# Patient Record
Sex: Male | Born: 1960 | Race: White | Hispanic: No | Marital: Married | Smoking: Never smoker
Health system: Southern US, Community
[De-identification: ages and names within clinical notes are randomized; demographics above are authoritative.]

## PROBLEM LIST (undated history)

## (undated) DIAGNOSIS — K55069 Acute infarction of intestine, part and extent unspecified: Secondary | ICD-10-CM

## (undated) DIAGNOSIS — I10 Essential (primary) hypertension: Secondary | ICD-10-CM

## (undated) DIAGNOSIS — K219 Gastro-esophageal reflux disease without esophagitis: Secondary | ICD-10-CM

## (undated) DIAGNOSIS — I81 Portal vein thrombosis: Secondary | ICD-10-CM

## (undated) DIAGNOSIS — I2510 Atherosclerotic heart disease of native coronary artery without angina pectoris: Secondary | ICD-10-CM

## (undated) HISTORY — PX: HERNIA REPAIR: SHX51

## (undated) HISTORY — PX: CORONARY ANGIOPLASTY WITH STENT PLACEMENT: SHX49

## (undated) HISTORY — PX: TONSILLECTOMY: SHX5217

---

## 2007-10-03 ENCOUNTER — Ambulatory Visit: Payer: Self-pay | Admitting: General Practice

## 2008-05-31 ENCOUNTER — Emergency Department: Payer: Self-pay | Admitting: Unknown Physician Specialty

## 2011-12-02 ENCOUNTER — Ambulatory Visit: Payer: Self-pay | Admitting: General Practice

## 2013-01-26 ENCOUNTER — Ambulatory Visit: Payer: Self-pay | Admitting: Family Medicine

## 2013-01-29 ENCOUNTER — Ambulatory Visit: Payer: Self-pay | Admitting: Family Medicine

## 2013-02-01 ENCOUNTER — Ambulatory Visit: Payer: Self-pay | Admitting: Family Medicine

## 2015-04-10 DIAGNOSIS — K55069 Acute infarction of intestine, part and extent unspecified: Secondary | ICD-10-CM | POA: Insufficient documentation

## 2015-04-10 DIAGNOSIS — Z8719 Personal history of other diseases of the digestive system: Secondary | ICD-10-CM | POA: Insufficient documentation

## 2015-04-16 DIAGNOSIS — R9389 Abnormal findings on diagnostic imaging of other specified body structures: Secondary | ICD-10-CM | POA: Insufficient documentation

## 2016-02-15 ENCOUNTER — Other Ambulatory Visit: Payer: Self-pay | Admitting: Family Medicine

## 2016-02-15 DIAGNOSIS — N500 Atrophy of testis: Secondary | ICD-10-CM

## 2016-02-16 ENCOUNTER — Ambulatory Visit
Admission: RE | Admit: 2016-02-16 | Discharge: 2016-02-16 | Disposition: A | Discharge status: Home / Self Care | Payer: BLUE CROSS/BLUE SHIELD | Source: Ambulatory Visit | Attending: Family Medicine | Admitting: Family Medicine

## 2016-02-16 DIAGNOSIS — N500 Atrophy of testis: Secondary | ICD-10-CM | POA: Insufficient documentation

## 2016-02-16 DIAGNOSIS — R938 Abnormal findings on diagnostic imaging of other specified body structures: Secondary | ICD-10-CM | POA: Insufficient documentation

## 2016-02-16 DIAGNOSIS — N503 Cyst of epididymis: Secondary | ICD-10-CM | POA: Diagnosis not present

## 2016-02-16 DIAGNOSIS — N433 Hydrocele, unspecified: Secondary | ICD-10-CM | POA: Diagnosis not present

## 2016-06-08 DIAGNOSIS — J339 Nasal polyp, unspecified: Secondary | ICD-10-CM | POA: Insufficient documentation

## 2016-08-15 DIAGNOSIS — E785 Hyperlipidemia, unspecified: Secondary | ICD-10-CM | POA: Insufficient documentation

## 2016-08-15 DIAGNOSIS — I2510 Atherosclerotic heart disease of native coronary artery without angina pectoris: Secondary | ICD-10-CM | POA: Insufficient documentation

## 2016-08-15 DIAGNOSIS — I2583 Coronary atherosclerosis due to lipid rich plaque: Secondary | ICD-10-CM | POA: Insufficient documentation

## 2016-08-15 DIAGNOSIS — R0789 Other chest pain: Secondary | ICD-10-CM | POA: Insufficient documentation

## 2016-09-21 DIAGNOSIS — Z86718 Personal history of other venous thrombosis and embolism: Secondary | ICD-10-CM | POA: Insufficient documentation

## 2016-10-27 DIAGNOSIS — M79604 Pain in right leg: Secondary | ICD-10-CM | POA: Insufficient documentation

## 2017-04-28 ENCOUNTER — Observation Stay
Admission: EM | Admit: 2017-04-28 | Discharge: 2017-04-29 | Disposition: A | Discharge status: Home / Self Care | Payer: Worker's Compensation | Attending: Surgery | Admitting: Surgery

## 2017-04-28 ENCOUNTER — Emergency Department: Payer: Worker's Compensation

## 2017-04-28 ENCOUNTER — Encounter: Payer: Self-pay | Admitting: Emergency Medicine

## 2017-04-28 DIAGNOSIS — R52 Pain, unspecified: Secondary | ICD-10-CM

## 2017-04-28 DIAGNOSIS — S2249XA Multiple fractures of ribs, unspecified side, initial encounter for closed fracture: Secondary | ICD-10-CM | POA: Diagnosis present

## 2017-04-28 DIAGNOSIS — Z955 Presence of coronary angioplasty implant and graft: Secondary | ICD-10-CM | POA: Insufficient documentation

## 2017-04-28 DIAGNOSIS — S27321A Contusion of lung, unilateral, initial encounter: Secondary | ICD-10-CM | POA: Diagnosis not present

## 2017-04-28 DIAGNOSIS — Z7982 Long term (current) use of aspirin: Secondary | ICD-10-CM | POA: Insufficient documentation

## 2017-04-28 DIAGNOSIS — Y93H3 Activity, building and construction: Secondary | ICD-10-CM | POA: Diagnosis not present

## 2017-04-28 DIAGNOSIS — S270XXA Traumatic pneumothorax, initial encounter: Secondary | ICD-10-CM | POA: Diagnosis not present

## 2017-04-28 DIAGNOSIS — W1789XA Other fall from one level to another, initial encounter: Secondary | ICD-10-CM | POA: Diagnosis not present

## 2017-04-28 DIAGNOSIS — I252 Old myocardial infarction: Secondary | ICD-10-CM | POA: Diagnosis not present

## 2017-04-28 DIAGNOSIS — Z7902 Long term (current) use of antithrombotics/antiplatelets: Secondary | ICD-10-CM | POA: Diagnosis not present

## 2017-04-28 DIAGNOSIS — I2510 Atherosclerotic heart disease of native coronary artery without angina pectoris: Secondary | ICD-10-CM | POA: Diagnosis not present

## 2017-04-28 DIAGNOSIS — K219 Gastro-esophageal reflux disease without esophagitis: Secondary | ICD-10-CM | POA: Diagnosis not present

## 2017-04-28 DIAGNOSIS — Y990 Civilian activity done for income or pay: Secondary | ICD-10-CM | POA: Diagnosis not present

## 2017-04-28 DIAGNOSIS — M25521 Pain in right elbow: Secondary | ICD-10-CM | POA: Insufficient documentation

## 2017-04-28 DIAGNOSIS — I10 Essential (primary) hypertension: Secondary | ICD-10-CM | POA: Insufficient documentation

## 2017-04-28 DIAGNOSIS — S2231XA Fracture of one rib, right side, initial encounter for closed fracture: Secondary | ICD-10-CM | POA: Insufficient documentation

## 2017-04-28 DIAGNOSIS — M25562 Pain in left knee: Secondary | ICD-10-CM | POA: Insufficient documentation

## 2017-04-28 DIAGNOSIS — Y9261 Building [any] under construction as the place of occurrence of the external cause: Secondary | ICD-10-CM | POA: Diagnosis not present

## 2017-04-28 DIAGNOSIS — Z86718 Personal history of other venous thrombosis and embolism: Secondary | ICD-10-CM | POA: Diagnosis not present

## 2017-04-28 DIAGNOSIS — S2241XA Multiple fractures of ribs, right side, initial encounter for closed fracture: Secondary | ICD-10-CM | POA: Diagnosis not present

## 2017-04-28 DIAGNOSIS — W19XXXA Unspecified fall, initial encounter: Secondary | ICD-10-CM | POA: Diagnosis not present

## 2017-04-28 DIAGNOSIS — Z79899 Other long term (current) drug therapy: Secondary | ICD-10-CM | POA: Diagnosis not present

## 2017-04-28 HISTORY — DX: Gastro-esophageal reflux disease without esophagitis: K21.9

## 2017-04-28 HISTORY — DX: Atherosclerotic heart disease of native coronary artery without angina pectoris: I25.10

## 2017-04-28 HISTORY — DX: Acute infarction of intestine, part and extent unspecified: K55.069

## 2017-04-28 HISTORY — DX: Portal vein thrombosis: I81

## 2017-04-28 HISTORY — DX: Essential (primary) hypertension: I10

## 2017-04-28 HISTORY — DX: Hemochromatosis, unspecified: E83.119

## 2017-04-28 LAB — CBC
HEMATOCRIT: 43.3 % (ref 40.0–52.0)
HEMOGLOBIN: 15.1 g/dL (ref 13.0–18.0)
MCH: 31.9 pg (ref 26.0–34.0)
MCHC: 34.9 g/dL (ref 32.0–36.0)
MCV: 91.4 fL (ref 80.0–100.0)
Platelets: 174 10*3/uL (ref 150–440)
RBC: 4.74 MIL/uL (ref 4.40–5.90)
RDW: 13.9 % (ref 11.5–14.5)
WBC: 9.7 10*3/uL (ref 3.8–10.6)

## 2017-04-28 LAB — BASIC METABOLIC PANEL
Anion gap: 8 (ref 5–15)
BUN: 13 mg/dL (ref 6–20)
CHLORIDE: 105 mmol/L (ref 101–111)
CO2: 25 mmol/L (ref 22–32)
CREATININE: 0.91 mg/dL (ref 0.61–1.24)
Calcium: 9.5 mg/dL (ref 8.9–10.3)
GFR calc Af Amer: >60 mL/min (ref >60)
GFR calc non Af Amer: >60 mL/min (ref >60)
Glucose, Bld: 120 mg/dL — ABNORMAL HIGH (ref 65–99)
POTASSIUM: 3.3 mmol/L — AB (ref 3.5–5.1)
SODIUM: 138 mmol/L (ref 135–145)

## 2017-04-28 LAB — PROTIME-INR
INR: 1.03
PROTHROMBIN TIME: 13.5 s (ref 11.4–15.2)

## 2017-04-28 MED ORDER — OXYCODONE-ACETAMINOPHEN 5-325 MG PO TABS
1.0000 | ORAL_TABLET | ORAL | Status: DC | PRN
  Administered 2017-04-28: 1 via ORAL
  Administered 2017-04-28 – 2017-04-29 (×2): 2 via ORAL
  Administered 2017-04-29: 1 via ORAL
  Filled 2017-04-28: qty 1
  Filled 2017-04-28 (×3): qty 2

## 2017-04-28 MED ORDER — ONDANSETRON 4 MG PO TBDP
4.0000 mg | ORAL_TABLET | Freq: Four times a day (QID) | ORAL | Status: DC | PRN
  Filled 2017-04-28: qty 1

## 2017-04-28 MED ORDER — MORPHINE SULFATE (PF) 4 MG/ML IV SOLN
4.0000 mg | Freq: Once | INTRAVENOUS | Status: AC
  Administered 2017-04-28: 4 mg via INTRAVENOUS
  Filled 2017-04-28: qty 1

## 2017-04-28 MED ORDER — ACETAMINOPHEN 650 MG RE SUPP: 650.0000 mg | Freq: Four times a day (QID) | RECTAL | Status: DC | PRN

## 2017-04-28 MED ORDER — IOPAMIDOL (ISOVUE-370) INJECTION 76%
100.0000 mL | Freq: Once | INTRAVENOUS | Status: AC | PRN
  Administered 2017-04-28: 100 mL via INTRAVENOUS

## 2017-04-28 MED ORDER — ONDANSETRON HCL 4 MG/2ML IJ SOLN
4.0000 mg | Freq: Once | INTRAMUSCULAR | Status: AC
  Administered 2017-04-28: 4 mg via INTRAVENOUS
  Filled 2017-04-28: qty 2

## 2017-04-28 MED ORDER — ONDANSETRON HCL 4 MG/2ML IJ SOLN: 4.0000 mg | Freq: Four times a day (QID) | INTRAMUSCULAR | Status: DC | PRN

## 2017-04-28 MED ORDER — ASPIRIN EC 81 MG PO TBEC
81.0000 mg | DELAYED_RELEASE_TABLET | Freq: Every day | ORAL | Status: DC
  Administered 2017-04-29: 81 mg via ORAL
  Filled 2017-04-28: qty 1

## 2017-04-28 MED ORDER — MORPHINE SULFATE (PF) 2 MG/ML IV SOLN
INTRAVENOUS | Status: AC
  Filled 2017-04-28: qty 1

## 2017-04-28 MED ORDER — TICAGRELOR 90 MG PO TABS
90.0000 mg | ORAL_TABLET | Freq: Two times a day (BID) | ORAL | Status: DC
  Administered 2017-04-28 – 2017-04-29 (×3): 90 mg via ORAL
  Filled 2017-04-28 (×4): qty 1

## 2017-04-28 MED ORDER — ROSUVASTATIN CALCIUM 10 MG PO TABS
10.0000 mg | ORAL_TABLET | Freq: Every day | ORAL | Status: DC
  Administered 2017-04-28 – 2017-04-29 (×2): 10 mg via ORAL
  Filled 2017-04-28 (×3): qty 1

## 2017-04-28 MED ORDER — HYDROMORPHONE HCL 1 MG/ML IJ SOLN
1.0000 mg | Freq: Once | INTRAMUSCULAR | Status: AC
  Administered 2017-04-28: 1 mg via INTRAVENOUS
  Filled 2017-04-28: qty 1

## 2017-04-28 MED ORDER — ENOXAPARIN SODIUM 40 MG/0.4ML ~~LOC~~ SOLN
40.0000 mg | SUBCUTANEOUS | Status: DC
  Filled 2017-04-28: qty 0.4

## 2017-04-28 MED ORDER — MORPHINE SULFATE (PF) 2 MG/ML IV SOLN
2.0000 mg | INTRAVENOUS | Status: DC | PRN
  Administered 2017-04-28: 2 mg via INTRAVENOUS

## 2017-04-28 MED ORDER — METOPROLOL SUCCINATE 12.5 MG HALF TABLET
12.5000 mg | ORAL_TABLET | Freq: Every day | ORAL | Status: DC
  Administered 2017-04-28 – 2017-04-29 (×2): 12.5 mg via ORAL
  Filled 2017-04-28 (×2): qty 1

## 2017-04-28 MED ORDER — ACETAMINOPHEN 325 MG PO TABS: 650.0000 mg | ORAL_TABLET | Freq: Four times a day (QID) | ORAL | Status: DC | PRN

## 2017-04-28 MED ORDER — PANTOPRAZOLE SODIUM 40 MG PO TBEC
40.0000 mg | DELAYED_RELEASE_TABLET | Freq: Every day | ORAL | Status: DC
  Administered 2017-04-29: 40 mg via ORAL
  Filled 2017-04-28: qty 1

## 2017-04-28 MED ORDER — SODIUM CHLORIDE 0.9 % IV BOLUS (SEPSIS)
1000.0000 mL | Freq: Once | INTRAVENOUS | Status: AC
  Administered 2017-04-28: 1000 mL via INTRAVENOUS

## 2017-04-28 NOTE — Progress Notes (Signed)
Dr. Excell Seltzer ordered telemetry monitoring for pt.

## 2017-04-28 NOTE — ED Notes (Signed)
c collar removed by dr Lenard Lance

## 2017-04-28 NOTE — ED Notes (Addendum)
Bruising/abrasion like area to lower back. Swelling to right neck and behind right ear; bruising also present here. Bruising to trunk at left mid axillary. Pt fell from second story of house.  No LOC per pt.  Bruising to both knees.  Knees are old bruises from blood thinners per pt. Pt reports has bruises all the time r/t his blood thinner

## 2017-04-28 NOTE — Progress Notes (Signed)
Pts. Wife requesting pt. Be on telemetry box.Dr. Excell Seltzer paged 3 times with no return call.

## 2017-04-28 NOTE — ED Notes (Signed)
Given water, ok per dr Lenard Lance

## 2017-04-28 NOTE — ED Notes (Signed)
Pt completed COC and w/c urine per profile; pt given given his copy of COC and employer present in pt room got his copy; hand delivered specimen and COC to lab for carrier pickup

## 2017-04-28 NOTE — Progress Notes (Signed)
Patient is admitted to room 138 from ED. A&O x4. Ambulated with SBA. Wife at bedside and asked many questions. Is very attentive. Bruising on chest, back, and neck area. Bed alarm on for safety. Oriented to room, call light, TV, and bed controls.

## 2017-04-28 NOTE — ED Notes (Signed)
Patient transported to CT 

## 2017-04-28 NOTE — ED Notes (Signed)
philly collar applied 

## 2017-04-28 NOTE — ED Notes (Signed)
Pt presents post fall from 6-7 feet. Denies LOC but states that he was SOB at the time of the injury. States that he hit his head on something, possibly a step and that he hit his right shoulder. He c/o right shoulder pain, mid back pain, and still some SOB. He is on Brilinta; has hemachromatosis hx. Pt in obvious pain.

## 2017-04-28 NOTE — ED Provider Notes (Signed)
[facility] Emergency Department Provider Note  Time seen: 12:44 PM  I have reviewed the triage vital signs and the nursing notes.   HISTORY  Chief Complaint Fall    HPI Seth Long is a 56 y.o. 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 Patient denies LOC. Patient's main complaints are right-sided neck pain, right chest pain and shortness of breath. Patient came by private vehicle.States he's been ambulatory without issue. He is also complaining of right elbow and left knee pain. C-collar placed in triage.  Past Medical History:  Diagnosis Date  . Coronary artery disease     There are no active problems to display for this patient.   Past Surgical History:  Procedure Laterality Date  . CORONARY ANGIOPLASTY WITH STENT PLACEMENT      Prior to Admission medications   Not on File    No Known Allergies  No family history on file.  Social History Social History  Substance Use Topics  . Smoking status: Never Smoker  . Smokeless tobacco: Never Used  . Alcohol use No    Review of Systems Constitutional: Negative for fever.Negative for LOC. Cardiovascular: Positive for right chest pain Respiratory: Mild Shortness of breath Gastrointestinal: Negative for abdominal pain Musculoskeletal: States some lower back pain. Positive for right elbow pain. Positive for left knee pain. Positive for right neck pain. Neurological: Negative for . Denies any focal weakness or numbness. All other ROS negative  ____________________________________________   PHYSICAL EXAM:  VITAL SIGNS: ED Triage Vitals  Enc Vitals Group     BP 04/28/17 1205 134/88     Pulse Rate 04/28/17 1205 65     Resp 04/28/17 1205 20     Temp 04/28/17 1205 98.4 F (36.9  C)     Temp Source 04/28/17 1205 Oral     SpO2 04/28/17 1205 100 %     Weight 04/28/17 1205 174 lb (78.9 kg)     Height 04/28/17 1205 5\' 6"  (1.676 m)     Head Circumference --      Peak Flow --      Pain Score 04/28/17 1203 8     Pain Loc --      Pain Edu? --      Excl. in GC? --     Constitutional: Alert and oriented. Patient appears to be uncomfortable. Eyes: Normal exam ENT   Head: Normocephalic and atraumatic   Mouth/Throat: Mucous membranes are moist. No oral trauma identified. Cardiovascular: Normal rate, regular rhythm. No murmur Respiratory: Normal respiratory effort without tachypnea nor retractions. Breath sounds are clear. Right-sided chest tenderness. Gastrointestinal: Soft and nontender. No distention.  Musculoskeletal: Mild swelling to right elbow with mild tenderness to palpation good range of motion. Neurovascular intact distally. Mild tenderness to left knee, neurovascularly intact distally, good range of motion. Patient does have mild cervical spine tenderness especially to the right sided paraspinal muscles. Patient also has edema/swelling to the right lateral neck. No obvious thrill or bruit. C-collar in place. Patient rolled for back palpation. No midline tenderness patient does have bruising across the mid to lower back. Neurologic:  Normal speech and language. No gross focal neurologic deficits Skin:  Skin is warm, dry and intact.  Psychiatric: Mood and affect are normal.   ____________________________________________  RADIOLOGY  Multiple right-sided rib fractures  ____________________________________________   INITIAL IMPRESSION / ASSESSMENT AND PLAN / ED COURSE  Pertinent labs & imaging results that were available during my care of the patient were reviewed by me and considered in my medical decision making (see chart for details).  Patient presents to the emergency department after an approximate 10 foot fall. Patient will require CT imaging  of the head, neck, chest, abdomen, pelvis. We'll also do CT angiography of the neck given right-sided neck swelling. Patient states mild shortness of breath with moderate right chest wall tenderness to palpation we will obtain a portable chest x-ray prior to CT scan.  Portable chest x-ray appears to show inflated lungs bilaterally, no sign of pneumothorax. We will have the patient transported to CT for urgent imaging. I discussed the patient with CT we will not be waiting for labs. I reviewed the patient's care everywhere chart today he has no sign of kidney dysfunction.  CT scan shows multiple right-sided rib fractures very small pneumothorax and pulmonary contusion. Discussed this with Dr. Earlene Plater of Gen. surgery who will be admitting for pain control and close monitoring. Patient did have a left lung nodule he states this is known and is followed at [facility]. Patient agreeable plan for admission. Currently satting 93% on room air.  ____________________________________________   FINAL CLINICAL IMPRESSION(S) / ED DIAGNOSES  Fall Right-sided rib fractures Pneumothorax Pulmonary contusion   Minna Antis, MD 04/28/17 1452

## 2017-04-28 NOTE — ED Notes (Signed)
Pt resting at this time, NAD currently.  Family remains at bedside. Waiting on admission.

## 2017-04-28 NOTE — ED Notes (Signed)
Consult at bedside.

## 2017-04-28 NOTE — H&P (Signed)
SURGICAL HISTORY & PHYSICAL (cpt [phone])  HISTORY OF PRESENT ILLNESS (HPI):  56 y.o. 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 Patient then got in his car and drove himself to [facility] ED against the advice of his wife, who expresses concern regarding MI patient experienced 8 mo ago, for which he underwent PCI with stent placement and was started on Brilenta in the context of recently-diagnosed hemochromatosis. Patient otherwise reports improved Right neck pain with no abdominal pain, N/V, fever/chills, or neurologic deficits and c-collar removed by ED physician.  PAST MEDICAL HISTORY (PMH):  Past Medical History:  Diagnosis Date  . Coronary artery disease   . GERD (gastroesophageal reflux disease)   . Hemochromatosis   . HTN (hypertension)   . Mesenteric vein thrombosis     Reviewed. Otherwise negative.   PAST SURGICAL HISTORY (PSH):  Past Surgical History:  Procedure Laterality Date  . CORONARY ANGIOPLASTY WITH STENT PLACEMENT      Reviewed. Otherwise negative.   MEDICATIONS:  Prior to Admission medications   Medication Sig Start Date End Date Taking? Authorizing Provider  acetaminophen (TYLENOL) 325 MG tablet Take 650 mg by mouth every 6 (six) hours as needed. 04/12/15  Yes [provider]  aspirin EC 81 MG tablet Take 81 mg by mouth daily. 08/16/16 08/16/17 Yes [provider]  BRILINTA 90 MG TABS tablet Take 90 mg by mouth 2 (two) times daily. 04/08/17  Yes [provider]  cetirizine (ZYRTEC) 10 MG tablet Take 10 mg by mouth daily.   Yes [provider]  metoprolol succinate (TOPROL-XL) 25 MG 24 hr tablet Take 12.5 mg by mouth daily. 04/08/17  Yes  [provider]  nitroGLYCERIN (NITROSTAT) 0.4 MG SL tablet Place 0.4 mg under the tongue every 5 (five) minutes x 3 doses as needed for chest pain. 08/16/16 08/16/17 Yes [provider]  pantoprazole (PROTONIX) 40 MG tablet Take 40 mg by mouth daily. 04/23/17  Yes [provider]  rosuvastatin (CRESTOR) 10 MG tablet Take 10 mg by mouth daily. 04/25/17  Yes [provider]     ALLERGIES:  No Known Allergies   SOCIAL HISTORY:  Social History   Social History  . Marital status: Married    Spouse name: N/A  . Number of children: N/A  . Years of education: N/A   Occupational History  . Not on file.   Social History Main Topics  . Smoking status: Never Smoker  . Smokeless tobacco: Never Used  . Alcohol use No  . Drug use: Unknown  . Sexual activity: Not on file   Other Topics Concern  . Not on file   Social History Narrative  . No narrative on file    The patient currently resides (home / rehab facility / nursing home): Home  The patient normally is (ambulatory / bedbound): Ambulatory   FAMILY HISTORY:  Family History  Problem Relation Age of Onset  . Hemochromatosis Mother        carrier  . Hemochromatosis Father        carrier  . Hypertension Brother    Otherwise negative.   REVIEW OF SYSTEMS:  Constitutional: denies any other weight loss,  fever, chills, or sweats  Eyes: denies any other vision changes, history of eye injury  ENT: denies sore throat, hearing problems  Respiratory: shortness of breath improved in ED with pain control, denies wheezing  Cardiovascular: chest pain as per HPI, denies palpitations or chest tightness Gastrointestinal: denies abdominal pain, N/V, or diarrhea  Genitourinary: denies burning with urination or urinary frequency Musculoskeletal: denies any other joint pains or cramps except as per HPI Skin: Denies any other rashes or skin discolorations  Neurological: denies any other headache, dizziness,  weakness  Psychiatric: denies any other depression, anxiety   All other review of systems were otherwise negative.  VITAL SIGNS:  Temp:  [98.4 F (36.9 C)] 98.4 F (36.9 C) (08/17 1205) Pulse Rate:  [56-75] 72 (08/17 1500) Resp:  [15-30] 15 (08/17 1500) BP: (92-134)/(73-88) 118/80 (08/17 1500) SpO2:  [93 %-100 %] 96 % (08/17 1500) Weight:  [174 lb (78.9 kg)] 174 lb (78.9 kg) (08/17 1205)     Height: 5\' 6"  (167.6 cm) Weight: 174 lb (78.9 kg) BMI (Calculated): 28.1   INTAKE/OUTPUT:  This shift: No intake/output data recorded.  Last 2 shifts: @IOLAST2SHIFTS @  PHYSICAL EXAM:  Constitutional:  -- Normal body habitus  -- Awake, alert, and oriented x3  Eyes:  -- Pupils equally round and reactive to light  -- No scleral icterus  Ear, nose, throat:  -- No jugular venous distension  -- No carotid bruits appreciated  Pulmonary:  -- No crackles  -- Equal breath sounds bilaterally, though Right chest pain with deep breaths -- Breathing non-labored at rest Cardiovascular:  -- S1, S2 present  -- No pericardial rubs  Gastrointestinal:  -- Abdomen soft, nontender, nondistended, no guarding/rebound  -- No abdominal masses appreciated, pulsatile or otherwise  Musculoskeletal and Integumentary:  -- Wounds or skin discoloration: Right mid-/lower- back ecchymosis, mild abrasions -- Trunk: Mild Right posterior-lateral neck tenderness to palpation, moderate Right mid-/lower- back tenderness to palpation -- Extremities: B/L UE and LE FROM, hands and feet warm, no edema, mild B/L various stages of ecchymoses/bruises Neurologic:  -- Motor function: Intact and symmetric -- Sensation: Intact and symmetric  Pulse/Doppler Exam: (p=palpable; d=doppler signals; 0=none)    Right   Left   Rad  p   p   Fem  p   p   DP  p   p   Labs:  CBC Latest Ref Rng & Units 04/28/2017  WBC 3.8 - 10.6 K/uL 9.7  Hemoglobin 13.0 - 18.0 g/dL 16.1  Hematocrit 09.6 - 52.0 % 43.3  Platelets 150 - 440 K/uL 174    CMP Latest Ref Rng & Units 04/28/2017  Glucose 65 - 99 mg/dL 045(W)  BUN 6 - 20 mg/dL 13  Creatinine 0.98 - 1.19 mg/dL 1.47  Sodium 829 - 562 mmol/L 138  Potassium 3.5 - 5.1 mmol/L 3.3(L)  Chloride 101 - 111 mmol/L 105  CO2 22 - 32 mmol/L 25  Calcium 8.9 - 10.3 mg/dL 9.5   Imaging studies:  CT Chest with IV Contrast (04/28/2017) 1. Small pneumothorax best seen in the anterior right base region without tension component. There is subcutaneous air on the right.  2. Fairly small pleural effusion on the right with questionable contusion right base posteriorly. Bibasilar atelectasis also evident.  3. Fractures, displaced, of the posterolateral right eighth, ninth, and tenth ribs. Displaced fracture posterior right eleventh rib. Minimally displaced fracture anterior right twelfth rib.  4. There is a 1.1 x 0.7 cm nodular opacity in the left upper lobe posteriorly  near the apex. This nodular opacity does not appear posttraumatic and warrants additional surveillance. Consider one of the following in 3 months for both low-risk and high-risk individuals: (a) repeat chest CT, (b) follow-up PET-CT, or (c) tissue sampling. This recommendation follows the consensus statement: Guidelines for Management of Incidental Pulmonary Nodules Detected on CT Images: From the Fleischner Society 2017; Radiology 2017; 284:228-243.  5. No mediastinal hematoma. Foci of aortic atherosclerosis and coronary artery calcification noted.  CT Abdomen and Pelvis with IV Contrast (04/28/2017): 1. Soft tissue hematoma in the right buttocks region. No other traumatic appearing lesion evident in the abdomen or pelvis.  2. Major visceral appear intact. No bowel wall thickening or bowel obstruction. There are a few sigmoid diverticular without diverticulitis.  3.  No renal or ureteral calculus.  No hydronephrosis.  4.  Rather minimal ventral hernia containing only fat.  CT Head and Neck without Contrast  (04/28/2017) Negative CT head Acute and chronic sinusitis with air-fluid levels in multiple Sinuses. Negative for cervical spine fracture. Small right pneumothorax  Assessment/Plan: (ICD-10's: S27.0XXA, S17.41XA, E45.409W) 56 y.o. male with small Right pneumothorax and pulmonary contusion associated with blunt trauma-associated and variably displaced Right posterior rib fractures 8 - 12, complicated by pertinent comorbidities including HTN, CAD, hemochromatosis, and GERD.    - pain control prn   - no indication for urgent surgical intervention at this time   - admit overnight for pain control and respiratory monitoring   - medical management of cormorbidities  All of the above findings and recommendations were discussed with the patient and his family, and all of his and his family's questions were answered to their expressed satisfaction.  -- Scherrie Gerlach Earlene Plater, MD, RPVI : [facility] - Partnering for exceptional care. Office: [phone]

## 2017-04-28 NOTE — ED Notes (Signed)
Primary RN Vikki Ports brought to my attention as CN that this patient's acuity was changed from a 2 to a 3. This RN, acting as Charge, Reassessed the patient's acuity and determined that 2 was appropriate and thus changed it as such back to a level 2 acuity.

## 2017-04-28 NOTE — ED Notes (Signed)
Report received care assumed.

## 2017-04-28 NOTE — ED Triage Notes (Addendum)
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

## 2017-04-29 ENCOUNTER — Observation Stay: Payer: Worker's Compensation

## 2017-04-29 DIAGNOSIS — S2241XA Multiple fractures of ribs, right side, initial encounter for closed fracture: Secondary | ICD-10-CM | POA: Diagnosis not present

## 2017-04-29 DIAGNOSIS — S270XXA Traumatic pneumothorax, initial encounter: Secondary | ICD-10-CM | POA: Diagnosis not present

## 2017-04-29 DIAGNOSIS — W19XXXA Unspecified fall, initial encounter: Secondary | ICD-10-CM | POA: Diagnosis not present

## 2017-04-29 DIAGNOSIS — S27321A Contusion of lung, unilateral, initial encounter: Secondary | ICD-10-CM | POA: Diagnosis not present

## 2017-04-29 LAB — CBC
HCT: 37.8 % — ABNORMAL LOW (ref 40.0–52.0)
HEMOGLOBIN: 13.2 g/dL (ref 13.0–18.0)
MCH: 32.9 pg (ref 26.0–34.0)
MCHC: 34.8 g/dL (ref 32.0–36.0)
MCV: 94.7 fL (ref 80.0–100.0)
Platelets: 135 10*3/uL — ABNORMAL LOW (ref 150–440)
RBC: 4 MIL/uL — AB (ref 4.40–5.90)
RDW: 14 % (ref 11.5–14.5)
WBC: 6.5 10*3/uL (ref 3.8–10.6)

## 2017-04-29 LAB — BASIC METABOLIC PANEL
Anion gap: 5 (ref 5–15)
BUN: 12 mg/dL (ref 6–20)
CO2: 28 mmol/L (ref 22–32)
Calcium: 8.4 mg/dL — ABNORMAL LOW (ref 8.9–10.3)
Chloride: 104 mmol/L (ref 101–111)
Creatinine, Ser: 0.82 mg/dL (ref 0.61–1.24)
GFR calc Af Amer: >60 mL/min (ref >60)
GFR calc non Af Amer: >60 mL/min (ref >60)
Glucose, Bld: 117 mg/dL — ABNORMAL HIGH (ref 65–99)
Potassium: 3.9 mmol/L (ref 3.5–5.1)
Sodium: 137 mmol/L (ref 135–145)

## 2017-04-29 MED ORDER — OXYCODONE-ACETAMINOPHEN 5-325 MG PO TABS
1.0000 | ORAL_TABLET | ORAL | 0 refills | Status: AC | PRN
Start: 2017-04-29 — End: ?

## 2017-04-29 NOTE — Care Management Obs Status (Signed)
MEDICARE OBSERVATION STATUS NOTIFICATION   Patient Details  Name: Seth Long MRN: [id number] Date of Birth: December 12, 1960   Medicare Observation Status Notification Given:  No   Discharged in less than 24 hours    Jessabelle Markiewicz A, RN 04/29/2017, 12:31 PM

## 2017-04-29 NOTE — Discharge Instructions (Addendum)
In addition to included general instructions for Rib Fractures,  Diet: Resume home heart healthy diet.   Activity: No heavy lifting >20 pounds (children, pets, laundry, garbage) or strenuous activity until follow-up, but light activity and walking are encouraged. Do not drive or drink alcohol if taking narcotic pain medications.  Medications: Resume all of your home medications. For mild to moderate pain: acetaminophen (Tylenol) or ibuprofen (if no kidney disease). Combining Tylenol with alcohol can substantially increase your risk of causing liver disease. Narcotic pain medications, if prescribed, can be used for severe pain, though may cause nausea, constipation, and drowsiness. Do not combine Tylenol and Percocet within a 6 hour period as Percocet contains Tylenol. If you do not need the narcotic pain medication, you do not need to fill the prescription.

## 2017-04-29 NOTE — Progress Notes (Signed)
Patient discharged to home. Wife will transport. DC & RX instructions given and patient acknowledged understanding. IV's removed. Belongings packed.

## 2017-04-30 LAB — HIV ANTIBODY (ROUTINE TESTING W REFLEX): HIV Screen 4th Generation wRfx: NONREACTIVE

## 2017-05-04 NOTE — Discharge Summary (Signed)
Physician Discharge Summary  Patient ID: Seth Long MRN: [id number] DOB/AGE: 58/18/1962 56 y.o.  Admit date: 04/28/2017 Discharge date: 05/04/2017  Admission Diagnoses:  Discharge Diagnoses:  Active Problems:   Multiple rib fractures involving four or more ribs   Discharged Condition: good  Hospital Course: 56 year-old Male presented to 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 Workup was found to be significant for CT demonstrating Right rib fractures (8 - 12), pulmonary contusion, and a tiny Right pneumothorax. Patient was accordingly admitted for pain control with pulmonary monitoring and morning chest x-ray, which did not demonstrate worsening of pneumothorax. The following morning, patient's pain was well-controlled along with resolution of SOB at admission, and discharge planning was accordingly initiated, and patient was safely able to be discharged home with appropriate pain medication, follow-up, and discharge instructions.  Consults: None  Significant Diagnostic Studies: radiology: CT scan: Right-sided rib fractures 8 - 12, tiny pneumothorax, lung contusion  Treatments: analgesia, pulmonary monitoring  Discharge Exam: Blood pressure 133/63, pulse 64, temperature 98.7 F (37.1 C), temperature source Oral, resp. rate 16, height 5\' 6"  (1.676 m), weight 174 lb 8 oz (79.2 kg), SpO2 94 %. General appearance: alert, cooperative, appears stated age and no distress Resp: clear to auscultation bilaterally Chest wall: no tenderness, right sided chest wall tenderness  Disposition: 01-Home or Self Care   Allergies as of 04/29/2017   No Known Allergies     Medication List    TAKE these medications   acetaminophen 325 MG tablet Commonly known as:  TYLENOL Take 650 mg by mouth every 6 (six) hours as needed.   aspirin EC 81 MG tablet Take 81 mg by mouth daily.    BRILINTA 90 MG Tabs tablet Generic drug:  ticagrelor Take 90 mg by mouth 2 (two) times daily.   cetirizine 10 MG tablet Commonly known as:  ZYRTEC Take 10 mg by mouth daily.   metoprolol succinate 25 MG 24 hr tablet Commonly known as:  TOPROL-XL Take 12.5 mg by mouth daily.   nitroGLYCERIN 0.4 MG SL tablet Commonly known as:  NITROSTAT Place 0.4 mg under the tongue every 5 (five) minutes x 3 doses as needed for chest pain.   oxyCODONE-acetaminophen 5-325 MG tablet Commonly known as:  PERCOCET/ROXICET Take 1-2 tablets by mouth every 4 (four) hours as needed for severe pain.   pantoprazole 40 MG tablet Commonly known as:  PROTONIX Take 40 mg by mouth daily.   rosuvastatin 10 MG tablet Commonly known as:  CRESTOR Take 10 mg by mouth daily.            Discharge Care Instructions        Start     Ordered   04/29/17 0000  oxyCODONE-acetaminophen (PERCOCET/ROXICET) 5-325 MG tablet  Every 4 hours PRN     04/29/17 1109     Follow-up Information    [place], [facility]. Schedule an appointment as soon as possible for a visit in 1 week(s).   Why:  Call as soon as able to schedule follow-up appointment in 1 - 2 weeks. Contact information: [address] [place] [address] [phone]        [facility] [place] Follow up.   Specialty:  General Surgery Why:  Please call if any questions or concerns of a surgical nature. Contact information: [address] [place] [address] [phone]  Signed: Ancil Linsey 05/04/2017, 6:46 PM

## 2017-05-08 DIAGNOSIS — S27321A Contusion of lung, unilateral, initial encounter: Secondary | ICD-10-CM

## 2017-05-08 DIAGNOSIS — S2241XA Multiple fractures of ribs, right side, initial encounter for closed fracture: Secondary | ICD-10-CM

## 2017-05-08 DIAGNOSIS — S270XXA Traumatic pneumothorax, initial encounter: Secondary | ICD-10-CM

## 2017-05-08 DIAGNOSIS — W19XXXA Unspecified fall, initial encounter: Secondary | ICD-10-CM

## 2017-06-08 ENCOUNTER — Ambulatory Visit: Payer: Worker's Compensation | Attending: Family Medicine | Admitting: Physical Therapy

## 2017-06-08 ENCOUNTER — Encounter: Payer: Self-pay | Admitting: Physical Therapy

## 2017-06-08 DIAGNOSIS — M545 Low back pain, unspecified: Secondary | ICD-10-CM

## 2017-06-08 DIAGNOSIS — M6281 Muscle weakness (generalized): Secondary | ICD-10-CM | POA: Insufficient documentation

## 2017-06-08 DIAGNOSIS — M25511 Pain in right shoulder: Secondary | ICD-10-CM

## 2017-06-09 NOTE — Therapy (Addendum)
[facility] [address] [place], [place], [address] Phone: [phone]   Fax:  [phone]  Physical Therapy Evaluation  Patient Details  Name: Seth Long MRN: [id number] Date of Birth: [birth year] Referring Provider: Dr. Zada Finders  Encounter Date: 06/08/2017      PT End of Session - 06/09/17 1419    Visit Number 1   Number of Visits 8   Date for PT Re-Evaluation 07/06/17   PT Start Time 1633   PT Stop Time 1801   PT Time Calculation (min) 88 min   Activity Tolerance Patient tolerated treatment well;Patient limited by pain   Behavior During Therapy [facility] for tasks assessed/performed        Past Medical History:  Diagnosis Date  . Coronary artery disease   . GERD (gastroesophageal reflux disease)   . Hemochromatosis   . HTN (hypertension)   . Mesenteric vein thrombosis     Past Surgical History:  Procedure Laterality Date  . CORONARY ANGIOPLASTY WITH STENT PLACEMENT      There were no vitals filed for this visit.        [facility] PT Assessment - 06/11/17 0001      Assessment   Medical Diagnosis Closed fracture of multiple ribs of right side with routine healing/ Pes anserine bursitis of Left knee.   Referring Provider Dr. Zada Finders   Onset Date/Surgical Date 04/28/17   Hand Dominance Right   Next MD Visit --  04/15/17   Prior Therapy No     Precautions   Precautions None     Balance Screen   Has the patient fallen in the past 6 months Yes   How many times? 1 with injury     Prior Function   Level of Independence Independent       Pt. s/p fall backwards off a 10 foot overhang resulting in multiple closed rib fractures/ pneumothorax.  See ER notes.  Pt. reports 1/10 R side/ mid-lower back pain currently at rest.          See HEP/ discussed use of rib bracing to control pain with daily tasks and riding in cars.      Pt. is a 56 y/o male s/p fall resulting in multiple R sided rib fractures/ L knee pain/  R sh. pain.  Pt. reports 1/10 R side pain currently at rest and >5/10 pain with increase activity.  Pt. reports soreness under skin and presents with minimal ecchymosis.  Pt. reports L posterior shoulder pain with side sleeping and R shoulder pain at end-range flexion/ abd.  Pt. is currently a back sleeper secondary to side/ sh. pain.  Lumbar AROM: flexion WNL/ extension 25% limited/ L and R rotn. 75% limited (pain)/ L and R lateral flexion 50% limited (R side pain).  B LE muscle strength grossly 5/5 MMT exept knee flexion 4+/5 MMT (increase R side pain with resisted hip flexion L/R).  L medial knee pain with palpation (no swelling noted).  Pt. experienced R sided muscle spasms during supine R LE stretches.  No pain with L LE stretches.  5/10 R sided back pain with bridging.   B shoulder AROM WNL except increase R sh. pain at end-range flexion/ abd. (extra time required).  B UE muscle strength grossly 5/5 MMT.  Pt. 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           PT Long Term Goals -  06/11/17 2004      PT LONG TERM GOAL #1   Title Pt. will decrease MODI to <30% to improve pain-free mobility/ return to work.     Baseline MODI: 62% on 9/27   Time 4   Period Weeks   Status New   Target Date 07/06/17     PT LONG TERM GOAL #2   Title Pt. I with HEP to increase lumbar AROM to WNL (all planes) to improve pain-free mobility/ household tasks.    Baseline Lumbar AROM: flexion WNL/ extension 25% limited/ L and R rotn. 75% limited (pain)/ L and R lateral flexion 50% limited (R side pain).  B LE muscle strength grossly 5/5 MMT exept knee flexion 4+/5 MMT (increase R side pain with resisted hip flexion L/R).    Time 4   Period Weeks   Status New   Target Date 07/06/17     PT LONG TERM GOAL #3   Title Pt. able to tolerate sidesleeping with no increase c/o sh./side pain to improve sleeping tolerance.    Baseline pain limited  with sidesleeping at this time.    Time 4   Period Weeks   Status New   Target Date 07/06/17     PT LONG TERM GOAL #4   Title Pt. able to climb ladder with no c/o pain or limitations to promote return to work.     Baseline pain limited ladder climbing/ work tasks.    Time 4   Period Weeks   Status New   Target Date 07/06/17     PT LONG TERM GOAL #5   Title Pt. able to return to work with good body mechanics/ no increase c/o pain on a full-time basis.     Baseline pt. currently out of work since injury.    Time 4   Period Weeks   Status New   Target Date 07/06/17              Patient will benefit from skilled therapeutic intervention in order to improve the following deficits and impairments:  Improper body mechanics, Pain, Decreased mobility, Decreased activity tolerance, Decreased range of motion, Decreased endurance, Decreased strength, Impaired UE functional use  Visit Diagnosis: Acute right-sided low back pain without sciatica  Muscle weakness (generalized)  Acute pain of right shoulder     Problem List Patient Active Problem List   Diagnosis Date Noted  . Pneumothorax, traumatic   . Multiple closed fractures of ribs of right side   . Fall   . Right pulmonary contusion   . Multiple rib fractures involving four or more ribs 04/28/2017   Cammie Mcgee, PT, DPT # [phone] 06/11/2017, 8:13 PM  [facility] [address] [place], [place], [address] Phone: [phone]   Fax:  [phone]  Name: Seth Long MRN: [id number] Date of Birth: 08-02-1961

## 2017-06-11 NOTE — Addendum Note (Signed)
Addended by: Cammie Mcgee on: 06/11/2017 08:18 PM   Modules accepted: Orders

## 2017-06-12 ENCOUNTER — Ambulatory Visit: Payer: Worker's Compensation | Attending: Family Medicine

## 2017-06-12 DIAGNOSIS — M545 Low back pain, unspecified: Secondary | ICD-10-CM

## 2017-06-12 DIAGNOSIS — M25511 Pain in right shoulder: Secondary | ICD-10-CM | POA: Diagnosis present

## 2017-06-12 DIAGNOSIS — M6281 Muscle weakness (generalized): Secondary | ICD-10-CM

## 2017-06-12 NOTE — Therapy (Signed)
[facility] [address] [place], [place], [address] Phone: [phone]   Fax:  [phone]  Physical Therapy Treatment  Patient Details  Name: Seth Long MRN: [id number] Date of Birth: 1961/01/20 Referring Provider: Dr. Zada Finders  Encounter Date: 06/12/2017      PT End of Session - 06/12/17 0909    Visit Number 2   Number of Visits 8   Date for PT Re-Evaluation 07/06/17   PT Start Time 0900   PT Stop Time 0945   PT Time Calculation (min) 45 min   Activity Tolerance Patient tolerated treatment well;Patient limited by pain   Behavior During Therapy [facility] for tasks assessed/performed      Past Medical History:  Diagnosis Date  . Coronary artery disease   . GERD (gastroesophageal reflux disease)   . Hemochromatosis   . HTN (hypertension)   . Mesenteric vein thrombosis     Past Surgical History:  Procedure Laterality Date  . CORONARY ANGIOPLASTY WITH STENT PLACEMENT      There were no vitals filed for this visit.      Subjective Assessment - 06/12/17 0859    Subjective Pt reports some 1-2/10 soreness in his R side/mid-lower back pain upon arrival today. He is performing HEP once/day but reports significant increase in pain following exercises;   Patient is accompained by: Family member   Pertinent History Pt. has worked as a Technical sales engineer for 10 years.  Pt. fell on 04/28/17 resulting in rib fracture/ pneumothorax/ L knee pes anserine bursitis/ R shoulder pain.    Pt. hoping to return to work with Light Duty status soon.  Pt. states he has to climb a ladder 1x/week and stair climb daily.     Limitations Standing;Sitting;Lifting;Walking;House hold activities   Patient Stated Goals Return to work/  Decrease back pain.   Currently in Pain? Yes   Pain Score 2    Pain Location Flank   Pain Orientation Right;Mid;Lower   Pain Descriptors / Indicators Burning   Pain Type Acute pain   Pain Onset More than a month ago          TREATMENT  Ther-ex SciFit L5 x 5 minutes during history; Supine canes for flexion 2 x 10, intermittent end range holds; Supine horizontal abduction stretch 10s hold 2 x 10; Hooklying trunk rotation 5s hold 2 x 10; R single knee to chest stretch 30s hold x 3; Double knee to chest stretch 30s hold x 3; Hooklying bilateral horizontal abduction AROM (angels) x 10; Seated scapular retractions 3s hold x 10; Seated gentle foam rolling over mid and lower back bilaterally by therapist, focus primarily on R side;  Pt requires cues to move slowly and mindfully through motions. Pain is constantly monitored. Pt denies any DOE with SciFit warm-up.                         PT Education - 06/12/17 0907    Education provided Yes   Education Details exercise form/technique, reinforced HEP   Person(s) Educated Patient   Methods Explanation   Comprehension Verbalized understanding             PT Long Term Goals - 06/11/17 2004      PT LONG TERM GOAL #1   Title Pt. will decrease MODI to <30% to improve pain-free mobility/ return to work.     Baseline MODI: 62% on 9/27   Time 4   Period Weeks  Status New   Target Date 07/06/17     PT LONG TERM GOAL #2   Title Pt. I with HEP to increase lumbar AROM to WNL (all planes) to improve pain-free mobility/ household tasks.    Baseline Lumbar AROM: flexion WNL/ extension 25% limited/ L and R rotn. 75% limited (pain)/ L and R lateral flexion 50% limited (R side pain).  B LE muscle strength grossly 5/5 MMT exept knee flexion 4+/5 MMT (increase R side pain with resisted hip flexion L/R).    Time 4   Period Weeks   Status New   Target Date 07/06/17     PT LONG TERM GOAL #3   Title Pt. able to tolerate sidesleeping with no increase c/o sh./side pain to improve sleeping tolerance.    Baseline pain limited with sidesleeping at this time.    Time 4   Period Weeks   Status New   Target Date 07/06/17     PT LONG TERM GOAL  #4   Title Pt. able to climb ladder with no c/o pain or limitations to promote return to work.     Baseline pain limited ladder climbing/ work tasks.    Time 4   Period Weeks   Status New   Target Date 07/06/17     PT LONG TERM GOAL #5   Title Pt. able to return to work with good body mechanics/ no increase c/o pain on a full-time basis.     Baseline pt. currently out of work since injury.    Time 4   Period Weeks   Status New   Target Date 07/06/17               Plan - 06/12/17 0910    Clinical Impression Statement Pt is able to progress through entire HEP but in notable discomfort. He tends to move fast and pt has to be encouraged to slow down and move midnfully through his motions. Encouraged pt to add supine shoulder abduction (angels) to his HEP and follow-up as scheduled.    Rehab Potential Good   PT Frequency 2x / week   PT Duration 4 weeks   PT Treatment/Interventions ADLs/Self Care Home Management;Aquatic Therapy;Cryotherapy;Moist Heat;Balance training;Therapeutic exercise;Therapeutic activities;Functional mobility training;Stair training;Gait training;Neuromuscular re-education;Patient/family education;Passive range of motion;Manual techniques   PT Next Visit Plan Add postural/ UE strengthening ex. to program.  Incorporate work-related tasks.     PT Home Exercise Plan See HEP   Consulted and Agree with Plan of Care Patient      Patient will benefit from skilled therapeutic intervention in order to improve the following deficits and impairments:  Improper body mechanics, Pain, Decreased mobility, Decreased activity tolerance, Decreased range of motion, Decreased endurance, Decreased strength, Impaired UE functional use  Visit Diagnosis: Acute right-sided low back pain without sciatica  Muscle weakness (generalized)     Problem List Patient Active Problem List   Diagnosis Date Noted  . Pneumothorax, traumatic   . Multiple closed fractures of ribs of right  side   . Fall   . Right pulmonary contusion   . Multiple rib fractures involving four or more ribs 04/28/2017   Lynnea Maizes PT, DPT   Darolyn Double 06/12/2017, 10:15 AM  [facility] [address] [place], [place], [address] Phone: [phone]   Fax:  [phone]  Name: HURSCHEL PAYNTER MRN: [id number] Date of Birth: 28-Nov-1960

## 2017-06-14 ENCOUNTER — Encounter: Payer: Self-pay | Admitting: Physical Therapy

## 2017-06-14 ENCOUNTER — Ambulatory Visit: Payer: Worker's Compensation | Attending: Family Medicine | Admitting: Physical Therapy

## 2017-06-14 DIAGNOSIS — M25511 Pain in right shoulder: Secondary | ICD-10-CM | POA: Diagnosis present

## 2017-06-14 DIAGNOSIS — M545 Low back pain, unspecified: Secondary | ICD-10-CM

## 2017-06-14 DIAGNOSIS — M6281 Muscle weakness (generalized): Secondary | ICD-10-CM | POA: Diagnosis present

## 2017-06-14 NOTE — Therapy (Signed)
[facility] [address] [place], [place], [address] Phone: [phone]   Fax:  [phone]  Physical Therapy Treatment  Patient Details  Name: Seth Long MRN: [id number] Date of Birth: [birth year] Referring Provider: Dr. Zada Finders  Encounter Date: 06/14/2017      PT End of Session - 06/16/17 1734    Visit Number 3   Number of Visits 8   Date for PT Re-Evaluation 07/06/17   PT Start Time 1543   PT Stop Time 1639   PT Time Calculation (min) 56 min   Activity Tolerance Patient tolerated treatment well;Patient limited by pain   Behavior During Therapy [facility] for tasks assessed/performed      Past Medical History:  Diagnosis Date  . Coronary artery disease   . GERD (gastroesophageal reflux disease)   . Hemochromatosis   . HTN (hypertension)   . Mesenteric vein thrombosis     Past Surgical History:  Procedure Laterality Date  . CORONARY ANGIOPLASTY WITH STENT PLACEMENT      There were no vitals filed for this visit.      Subjective Assessment - 06/16/17 1728    Subjective Pt. states he is having more muscle spasms in back/ R side over past couple days.  Pt. reports 1/10 R side/back pain currently.     Patient is accompained by: Family member   Pertinent History Pt. has worked as a Technical sales engineer for 10 years.  Pt. fell on 04/28/17 resulting in rib fracture/ pneumothorax/ L knee pes anserine bursitis/ R shoulder pain.    Pt. hoping to return to work with Light Duty status soon.  Pt. states he has to climb a ladder 1x/week and stair climb daily.     Limitations Standing;Sitting;Lifting;Walking;House hold activities   Patient Stated Goals Return to work/  Decrease back pain.   Currently in Pain? Yes   Pain Score 1    Pain Location Flank   Pain Orientation Right;Lower;Mid   Pain Descriptors / Indicators Spasm   Pain Type Acute pain          TREATMENT  Ther-ex SciFit L5.5 x 10 minutes B UE/LE;  Pt. Reports  increase muscle spasms in mid-back.   Walking in hallway/ clinic with consistent arm swing/ stride length/ cadence Supine bolster bridging/ dead bug/ chest press and sh. flexion with wt. Wand/ hip abduction (no resistance)/ sh. Diagonals with wt. Wand/ supine hip abd. With GTB 20x each.  R side discomfort but no significant increase in pain with supine ex.   Seated scapular retractions with RTB 3s hold x 20; Supine LE/lumbar stretches 11 min.   STM to mid-low back in seated posture  Pt requires cues to move slowly and mindfully through motions. Pain is constantly monitored. Pt denies any DOE with SciFit warm-up.         Pt. experienced several episodes of back/side muscle spasms with supine LE ex./ position changes from supine to sitting on edge of mat table.  Pt. highly motivated with therex. program and progressing well with walking/ supine ex.  No change with HEP at this time and PT with progress HEP with more resisted ex. next tx. session.       PT Long Term Goals - 06/11/17 2004      PT LONG TERM GOAL #1   Title Pt. will decrease MODI to <30% to improve pain-free mobility/ return to work.     Baseline MODI: 62% on 9/27   Time 4  Period Weeks   Status New   Target Date 07/06/17     PT LONG TERM GOAL #2   Title Pt. I with HEP to increase lumbar AROM to WNL (all planes) to improve pain-free mobility/ household tasks.    Baseline Lumbar AROM: flexion WNL/ extension 25% limited/ L and R rotn. 75% limited (pain)/ L and R lateral flexion 50% limited (R side pain).  B LE muscle strength grossly 5/5 MMT exept knee flexion 4+/5 MMT (increase R side pain with resisted hip flexion L/R).    Time 4   Period Weeks   Status New   Target Date 07/06/17     PT LONG TERM GOAL #3   Title Pt. able to tolerate sidesleeping with no increase c/o sh./side pain to improve sleeping tolerance.    Baseline pain limited with sidesleeping at this time.    Time 4   Period Weeks   Status New   Target  Date 07/06/17     PT LONG TERM GOAL #4   Title Pt. able to climb ladder with no c/o pain or limitations to promote return to work.     Baseline pain limited ladder climbing/ work tasks.    Time 4   Period Weeks   Status New   Target Date 07/06/17     PT LONG TERM GOAL #5   Title Pt. able to return to work with good body mechanics/ no increase c/o pain on a full-time basis.     Baseline pt. currently out of work since injury.    Time 4   Period Weeks   Status New   Target Date 07/06/17             Patient will benefit from skilled therapeutic intervention in order to improve the following deficits and impairments:  Improper body mechanics, Pain, Decreased mobility, Decreased activity tolerance, Decreased range of motion, Decreased endurance, Decreased strength, Impaired UE functional use  Visit Diagnosis: Acute right-sided low back pain without sciatica  Muscle weakness (generalized)  Acute pain of right shoulder     Problem List Patient Active Problem List   Diagnosis Date Noted  . Pneumothorax, traumatic   . Multiple closed fractures of ribs of right side   . Fall   . Right pulmonary contusion   . Multiple rib fractures involving four or more ribs 04/28/2017   Cammie Mcgee, PT, DPT # [phone] 06/16/2017, 5:40 PM  [facility] [address] [place], [place], [address] Phone: [phone]   Fax:  [phone]  Name: Seth Long MRN: [id number] Date of Birth: July 27, 1961

## 2017-06-19 ENCOUNTER — Ambulatory Visit: Payer: Worker's Compensation | Admitting: Physical Therapy

## 2017-06-19 DIAGNOSIS — M6281 Muscle weakness (generalized): Secondary | ICD-10-CM

## 2017-06-19 DIAGNOSIS — M545 Low back pain, unspecified: Secondary | ICD-10-CM

## 2017-06-19 DIAGNOSIS — M25511 Pain in right shoulder: Secondary | ICD-10-CM

## 2017-06-19 NOTE — Therapy (Signed)
[facility] [address] [place], [place], [address] Phone: [phone]   Fax:  [phone]  Physical Therapy Treatment  Patient Details  Name: Seth Long MRN: [id number] Date of Birth: Sep 25, 1960 Referring Provider: Dr. Zada Finders  Encounter Date: 06/19/2017      PT End of Session - 06/19/17 1120    Visit Number 4   Number of Visits 8   Date for PT Re-Evaluation 07/06/17   PT Start Time 1108   PT Stop Time 1202   PT Time Calculation (min) 54 min   Activity Tolerance Patient tolerated treatment well;Patient limited by pain   Behavior During Therapy [facility] for tasks assessed/performed      Past Medical History:  Diagnosis Date  . Coronary artery disease   . GERD (gastroesophageal reflux disease)   . Hemochromatosis   . HTN (hypertension)   . Mesenteric vein thrombosis     Past Surgical History:  Procedure Laterality Date  . CORONARY ANGIOPLASTY WITH STENT PLACEMENT      There were no vitals filed for this visit.      Subjective Assessment - 06/19/17 1118    Subjective L sh. pain and R rib discomfort at night (2/10).  Pt. states the muscle spasms in back have been better since last visit.  Pt. hoping to return to work with Light Duty status next Monday (06/26/17).     Patient is accompained by: Family member   Pertinent History Pt. has worked as a Technical sales engineer for 10 years.  Pt. fell on 04/28/17 resulting in rib fracture/ pneumothorax/ L knee pes anserine bursitis/ R shoulder pain.    Pt. hoping to return to work with Light Duty status soon.  Pt. states he has to climb a ladder 1x/week and stair climb daily.     Limitations Standing;Sitting;Lifting;Walking;House hold activities   Patient Stated Goals Return to work/  Decrease back pain.   Currently in Pain? Yes   Pain Score 2    Pain Location Flank   Pain Orientation Right;Lower   Pain Descriptors / Indicators Aching   Pain Type Acute pain        TREATMENT  Ther-ex SciFit L6 x 10 minutes B UE/LE;  Consistent cadence with no increase c/o pain.   Standing RTB ex.: scap. Retraction/ tricep ext./ bicep curls/ sh. Flexion/ sh. Abd./ horizontal abd./ diagonals 20x each. (see handouts).   Sled push/pull 40# 50 feet x 2 (proper technique/ no increase c/o pain) Walking in hallway/ clinic with consistent arm swing/ stride length/ cadence STM to mid-low back in seated posture  Pt requires cues to move slowly and mindfully through motions. Pain is constantly monitored.            PT Education - 06/19/17 1119    Education provided Yes   Education Details Standing sh./scapular ex. (see handouts)- issues RTB/GTB for progression   Person(s) Educated Patient   Methods Explanation;Demonstration;Handout   Comprehension Verbalized understanding;Returned demonstration             PT Long Term Goals - 06/11/17 2004      PT LONG TERM GOAL #1   Title Pt. will decrease MODI to <30% to improve pain-free mobility/ return to work.     Baseline MODI: 62% on 9/27   Time 4   Period Weeks   Status New   Target Date 07/06/17     PT LONG TERM GOAL #2   Title Pt. I with HEP to increase lumbar  AROM to WNL (all planes) to improve pain-free mobility/ household tasks.    Baseline Lumbar AROM: flexion WNL/ extension 25% limited/ L and R rotn. 75% limited (pain)/ L and R lateral flexion 50% limited (R side pain).  B LE muscle strength grossly 5/5 MMT exept knee flexion 4+/5 MMT (increase R side pain with resisted hip flexion L/R).    Time 4   Period Weeks   Status New   Target Date 07/06/17     PT LONG TERM GOAL #3   Title Pt. able to tolerate sidesleeping with no increase c/o sh./side pain to improve sleeping tolerance.    Baseline pain limited with sidesleeping at this time.    Time 4   Period Weeks   Status New   Target Date 07/06/17     PT LONG TERM GOAL #4   Title Pt. able to climb ladder with no c/o pain or limitations to  promote return to work.     Baseline pain limited ladder climbing/ work tasks.    Time 4   Period Weeks   Status New   Target Date 07/06/17     PT LONG TERM GOAL #5   Title Pt. able to return to work with good body mechanics/ no increase c/o pain on a full-time basis.     Baseline pt. currently out of work since injury.    Time 4   Period Weeks   Status New   Target Date 07/06/17               Plan - 06/19/17 1120    Clinical Impression Statement No episodes of back/ R sided muscle spasms during UE strengthening ex. program.  Pt. ambulates with increase cadence/ step pattern with no increase c/o pain.  Pt. has occasional R side/low back discomfort with standing lumbar flexion/ rotn.  Pt. demonstrates good technique with pushing/pulling sled with no incresae c/o pain.      Clinical Presentation Stable   Clinical Decision Making Moderate   Rehab Potential Good   PT Frequency 2x / week   PT Duration 4 weeks   PT Treatment/Interventions ADLs/Self Care Home Management;Aquatic Therapy;Cryotherapy;Moist Heat;Balance training;Therapeutic exercise;Therapeutic activities;Functional mobility training;Stair training;Gait training;Neuromuscular re-education;Patient/family education;Passive range of motion;Manual techniques   PT Next Visit Plan Discuss MD f/u visit.  RTW date?   PT Home Exercise Plan See HEP   Consulted and Agree with Plan of Care Patient      Patient will benefit from skilled therapeutic intervention in order to improve the following deficits and impairments:  Improper body mechanics, Pain, Decreased mobility, Decreased activity tolerance, Decreased range of motion, Decreased endurance, Decreased strength, Impaired UE functional use  Visit Diagnosis: Acute right-sided low back pain without sciatica  Muscle weakness (generalized)  Acute pain of right shoulder     Problem List Patient Active Problem List   Diagnosis Date Noted  . Pneumothorax, traumatic   .  Multiple closed fractures of ribs of right side   . Fall   . Right pulmonary contusion   . Multiple rib fractures involving four or more ribs 04/28/2017   Cammie Mcgee, PT, DPT # [phone] 06/19/2017, 8:23 PM  [facility] [address] [place], [place], [address] Phone: [phone]   Fax:  [phone]  Name: ORVA GWALTNEY MRN: [id number] Date of Birth: [birth year]

## 2017-06-21 ENCOUNTER — Ambulatory Visit: Payer: Worker's Compensation | Admitting: Physical Therapy

## 2017-06-21 DIAGNOSIS — M545 Low back pain, unspecified: Secondary | ICD-10-CM

## 2017-06-21 DIAGNOSIS — M25511 Pain in right shoulder: Secondary | ICD-10-CM

## 2017-06-21 DIAGNOSIS — M6281 Muscle weakness (generalized): Secondary | ICD-10-CM

## 2017-06-21 NOTE — Therapy (Signed)
[facility] [address] [place], [place], [address] Phone: [phone]   Fax:  [phone]  Physical Therapy Treatment  Patient Details  Name: Seth Long MRN: [id number] Date of Birth: March 08, 1961 Referring Provider: Dr. Zada Finders  Encounter Date: 06/21/2017      PT End of Session - 06/21/17 1714    Visit Number 5   Number of Visits 8   Date for PT Re-Evaluation 07/06/17   PT Start Time 1605   PT Stop Time 1656   PT Time Calculation (min) 51 min   Activity Tolerance Patient tolerated treatment well;Patient limited by pain   Behavior During Therapy [facility] for tasks assessed/performed      Past Medical History:  Diagnosis Date  . Coronary artery disease   . GERD (gastroesophageal reflux disease)   . Hemochromatosis   . HTN (hypertension)   . Mesenteric vein thrombosis     Past Surgical History:  Procedure Laterality Date  . CORONARY ANGIOPLASTY WITH STENT PLACEMENT      There were no vitals filed for this visit.      Subjective Assessment - 06/21/17 1621    Subjective Pt reports he has been doing his advanced HEP with only mild soreness that last only until the next day. Otherwise pt reports no changes since last session.    Patient is accompained by: Family member   Pertinent History Pt. has worked as a Technical sales engineer for 10 years.  Pt. fell on 04/28/17 resulting in rib fracture/ pneumothorax/ L knee pes anserine bursitis/ R shoulder pain.    Pt. hoping to return to work with Light Duty status soon.  Pt. states he has to climb a ladder 1x/week and stair climb daily.     Limitations Standing;Sitting;Lifting;Walking;House hold activities   Patient Stated Goals Return to work/  Decrease back pain.   Currently in Pain? No/denies   Pain Onset More than a month ago       Treatment:  Warm-up: SciFit L6 x 10 minutes B UE/LE  Ther-ex Standing Nautilus: 1 x 12 reps each with BUEs:  Low Row (30#), tricep pull-down  (30#), bicep curl (30#), reverse wood chops each side; cues for isolation of movement; pt with only minor transient pain with reverse wood chops, but pt said it was tolerable  Reviewed/ progressed HEP.    Manual therapy: STM to mid-low back in prone; increased muscle tightness noted on R side; pt had some mild pain with trigger point release and STM on R in the lower thoracic area.         PT Education - 06/21/17 1713    Education provided Yes   Education Details Standing UE resisted exercise with Nautilus, HEP instruction   Person(s) Educated Patient   Methods Explanation;Demonstration;Tactile cues;Verbal cues   Comprehension Verbalized understanding;Verbal cues required;Tactile cues required;Returned demonstration             PT Long Term Goals - 06/11/17 2004      PT LONG TERM GOAL #1   Title Pt. will decrease MODI to <30% to improve pain-free mobility/ return to work.     Baseline MODI: 62% on 9/27   Time 4   Period Weeks   Status New   Target Date 07/06/17     PT LONG TERM GOAL #2   Title Pt. I with HEP to increase lumbar AROM to WNL (all planes) to improve pain-free mobility/ household tasks.    Baseline Lumbar AROM: flexion WNL/ extension  25% limited/ L and R rotn. 75% limited (pain)/ L and R lateral flexion 50% limited (R side pain).  B LE muscle strength grossly 5/5 MMT exept knee flexion 4+/5 MMT (increase R side pain with resisted hip flexion L/R).    Time 4   Period Weeks   Status New   Target Date 07/06/17     PT LONG TERM GOAL #3   Title Pt. able to tolerate sidesleeping with no increase c/o sh./side pain to improve sleeping tolerance.    Baseline pain limited with sidesleeping at this time.    Time 4   Period Weeks   Status New   Target Date 07/06/17     PT LONG TERM GOAL #4   Title Pt. able to climb ladder with no c/o pain or limitations to promote return to work.     Baseline pain limited ladder climbing/ work tasks.    Time 4   Period Weeks    Status New   Target Date 07/06/17     PT LONG TERM GOAL #5   Title Pt. able to return to work with good body mechanics/ no increase c/o pain on a full-time basis.     Baseline pt. currently out of work since injury.    Time 4   Period Weeks   Status New   Target Date 07/06/17            Plan - 06/21/17 1715    Clinical Impression Statement Pt was able to perform UE resisted exercise at Nautilus with only minor transient pain. He had pain at 2-3/10 in R mid-lower back following session with one muscle spasm noted following transfer supine to standing at end of session. Pt is tolerating HEP well with only mild soreness. Pt advised to continue HEP and to skip resisted exercise for one day if soreness persists. Pt palns to return to work on 10/22 on light duty.   Clinical Presentation Stable   Clinical Decision Making Moderate   Rehab Potential Good   PT Frequency 2x / week   PT Duration 4 weeks   PT Treatment/Interventions ADLs/Self Care Home Management;Aquatic Therapy;Cryotherapy;Moist Heat;Balance training;Therapeutic exercise;Therapeutic activities;Functional mobility training;Stair training;Gait training;Neuromuscular re-education;Patient/family education;Passive range of motion;Manual techniques   PT Next Visit Plan Discuss MD f/u visit. Continue Nautilus resisted exercise progression, slay push/pull   PT Home Exercise Plan See HEP, no changes this visit   Consulted and Agree with Plan of Care Patient      Patient will benefit from skilled therapeutic intervention in order to improve the following deficits and impairments:  Improper body mechanics, Pain, Decreased mobility, Decreased activity tolerance, Decreased range of motion, Decreased endurance, Decreased strength, Impaired UE functional use  Visit Diagnosis: Acute right-sided low back pain without sciatica  Muscle weakness (generalized)  Acute pain of right shoulder     Problem List Patient Active Problem List    Diagnosis Date Noted  . Pneumothorax, traumatic   . Multiple closed fractures of ribs of right side   . Fall   . Right pulmonary contusion   . Multiple rib fractures involving four or more ribs 04/28/2017   Errin Whitelaw M Oley Lahaie, SPT Cammie Mcgee, PT, DPT # [phone] 06/22/2017, 7:46 AM  [facility] [address] [place], [place], [address] Phone: [phone]   Fax:  [phone]  Name: Seth Long MRN: [id number] Date of Birth: 05/16/1961

## 2017-06-26 ENCOUNTER — Ambulatory Visit: Payer: Worker's Compensation | Admitting: Physical Therapy

## 2017-06-27 ENCOUNTER — Ambulatory Visit: Payer: Worker's Compensation

## 2017-06-27 ENCOUNTER — Encounter: Payer: Self-pay | Admitting: Physical Therapy

## 2017-06-27 DIAGNOSIS — M545 Low back pain, unspecified: Secondary | ICD-10-CM

## 2017-06-27 DIAGNOSIS — M6281 Muscle weakness (generalized): Secondary | ICD-10-CM

## 2017-06-27 DIAGNOSIS — M25511 Pain in right shoulder: Secondary | ICD-10-CM

## 2017-06-27 NOTE — Therapy (Signed)
[facility] [address] [place], [place], [address] Phone: [phone]   Fax:  [phone]  Physical Therapy Treatment  Patient Details  Name: Seth Long MRN: [id number] Date of Birth: [birth year] Referring Provider: Dr. Zada Finders  Encounter Date: 06/27/2017      PT End of Session - 06/27/17 1559    Visit Number 6   Number of Visits 8   Date for PT Re-Evaluation 07/06/17   PT Start Time 1555   PT Stop Time 1645   PT Time Calculation (min) 50 min   Activity Tolerance Patient tolerated treatment well   Behavior During Therapy [facility] for tasks assessed/performed      Past Medical History:  Diagnosis Date  . Coronary artery disease   . GERD (gastroesophageal reflux disease)   . Hemochromatosis   . HTN (hypertension)   . Mesenteric vein thrombosis     Past Surgical History:  Procedure Laterality Date  . CORONARY ANGIOPLASTY WITH STENT PLACEMENT      There were no vitals filed for this visit.      Subjective Assessment - 06/27/17 1555    Subjective Pt reports he is doing better today. He says he has been doing his HEP with only minor soreness. Pt reports no pain at this time, but does note a slight soreness in posterior R thoracic region   Patient is accompained by: Family member   Pertinent History Pt. has worked as a Technical sales engineer for 10 years.  Pt. fell on 04/28/17 resulting in rib fracture/ pneumothorax/ L knee pes anserine bursitis/ R shoulder pain.    Pt. hoping to return to work with Light Duty status soon.  Pt. states he has to climb a ladder 1x/week and stair climb daily.     Limitations Standing;Sitting;Lifting;Walking;House hold activities   Patient Stated Goals Return to work/  Decrease back pain.   Currently in Pain? No/denies   Pain Onset More than a month ago      Treatment:  Warm-up: SciFit L7x B UE/LE position 4 (un-billed)  Ther-ex Standing Nautilus: 2 x 12 reps each with BUEs:            Low Row (40#), (increased)  tricep pull-down (30#),   bicep curl (30#),   reverse wood chops (30#)    Total gym    Lat pull down (to simulate attic ladder pull down for work) Administrator, sports rotation with cross-body pull to each direction in sitting (pt had tolerable increase in R thoracic back pain that improved with continued exercise)  Manual therapy: STM to mid-low back in prone; increased muscle tightness noted on R side; trigger point release near inferior angle of L scapula with tenderness.        PT Education - 06/27/17 1557    Education provided Yes   Education Details Ther-ex with Total gym and [employer]) Educated Patient   Methods Explanation;Demonstration;Tactile cues;Verbal cues   Comprehension Verbal cues required;Returned demonstration;Verbalized understanding;Tactile cues required             PT Long Term Goals - 06/11/17 2004      PT LONG TERM GOAL #1   Title Pt. will decrease MODI to <30% to improve pain-free mobility/ return to work.     Baseline MODI: 62% on 9/27   Time 4   Period Weeks   Status New   Target Date 07/06/17     PT LONG TERM GOAL #2   Title  Pt. I with HEP to increase lumbar AROM to WNL (all planes) to improve pain-free mobility/ household tasks.    Baseline Lumbar AROM: flexion WNL/ extension 25% limited/ L and R rotn. 75% limited (pain)/ L and R lateral flexion 50% limited (R side pain).  B LE muscle strength grossly 5/5 MMT exept knee flexion 4+/5 MMT (increase R side pain with resisted hip flexion L/R).    Time 4   Period Weeks   Status New   Target Date 07/06/17     PT LONG TERM GOAL #3   Title Pt. able to tolerate sidesleeping with no increase c/o sh./side pain to improve sleeping tolerance.    Baseline pain limited with sidesleeping at this time.    Time 4   Period Weeks   Status New   Target Date 07/06/17     PT LONG TERM GOAL #4   Title Pt. able to climb ladder with no c/o pain or limitations to  promote return to work.     Baseline pain limited ladder climbing/ work tasks.    Time 4   Period Weeks   Status New   Target Date 07/06/17     PT LONG TERM GOAL #5   Title Pt. able to return to work with good body mechanics/ no increase c/o pain on a full-time basis.     Baseline pt. currently out of work since injury.    Time 4   Period Weeks   Status New   Target Date 07/06/17               Plan - 06/27/17 1602    Clinical Impression Statement Overall pt is improving with full shoulder flexion, abduction AROM bilaterally. Pt's R shoulder IR remains limited on the R shoulder compared to the L. Pt has only mild pain at end range shoulder motions and with thoracic rotation, worse to the R. Pt is planning on returning to work on light duty Monday. He feels that he will be able to perform all light duty tasks without limitation at this time.   Clinical Presentation Stable   Clinical Decision Making Moderate   Rehab Potential Good   PT Frequency 2x / week   PT Duration 4 weeks   PT Treatment/Interventions ADLs/Self Care Home Management;Aquatic Therapy;Cryotherapy;Moist Heat;Balance training;Therapeutic exercise;Therapeutic activities;Functional mobility training;Stair training;Gait training;Neuromuscular re-education;Patient/family education;Passive range of motion;Manual techniques   PT Next Visit Plan Continue Nautilus and total gym resisted exercise progression, slay push/pull; trigger point release around R inferior angle   PT Home Exercise Plan See HEP, no changes this visit   Consulted and Agree with Plan of Care Patient      Patient will benefit from skilled therapeutic intervention in order to improve the following deficits and impairments:  Improper body mechanics, Pain, Decreased mobility, Decreased activity tolerance, Decreased range of motion, Decreased endurance, Decreased strength, Impaired UE functional use  Visit Diagnosis: Muscle weakness (generalized)  Acute  pain of right shoulder  Acute right-sided low back pain without sciatica     Problem List Patient Active Problem List   Diagnosis Date Noted  . Pneumothorax, traumatic   . Multiple closed fractures of ribs of right side   . Fall   . Right pulmonary contusion   . Multiple rib fractures involving four or more ribs 04/28/2017   This entire session was performed under direct supervision and direction of a licensed therapist/therapist assistant . I have personally read, edited and approve of the note as written.  Cassell Smiles, SPT Lynnea Maizes PT, DPT   06/27/2017, 5:10 PM  [facility] [address] [place], [place], [address] Phone: [phone]   Fax:  [phone]  Name: Seth Long MRN: [id number] Date of Birth: [birth year]

## 2017-06-29 ENCOUNTER — Encounter: Payer: Self-pay | Admitting: Physical Therapy

## 2017-06-29 ENCOUNTER — Ambulatory Visit: Payer: Worker's Compensation

## 2017-06-29 DIAGNOSIS — M545 Low back pain, unspecified: Secondary | ICD-10-CM

## 2017-06-29 DIAGNOSIS — M25511 Pain in right shoulder: Secondary | ICD-10-CM

## 2017-06-29 DIAGNOSIS — M6281 Muscle weakness (generalized): Secondary | ICD-10-CM

## 2017-06-29 NOTE — Therapy (Signed)
[place] [facility] [address] [place], [place], [address] Phone: [phone]   Fax:  [phone]  Physical Therapy Treatment  Patient Details  Name: Seth Long MRN: [id number] Date of Birth: 01/30/1961 Referring Provider: Dr. Zada Finderslmedo  Encounter Date: 06/29/2017      PT End of Session - 06/29/17 1650    Visit Number 7   Number of Visits 8   Date for PT Re-Evaluation 07/06/17   PT Start Time 1556   PT Stop Time 1650   PT Time Calculation (min) 54 min   Activity Tolerance Patient tolerated treatment well   Behavior During Therapy [facility] for tasks assessed/performed      Past Medical History:  Diagnosis Date  . Coronary artery disease   . GERD (gastroesophageal reflux disease)   . Hemochromatosis   . HTN (hypertension)   . Mesenteric vein thrombosis     Past Surgical History:  Procedure Laterality Date  . CORONARY ANGIOPLASTY WITH STENT PLACEMENT      There were no vitals filed for this visit.      Subjective Assessment - 06/29/17 1615    Subjective Pt reports that he had some soreness for about 48 hours after last session; pt reported that he was able to control the pain with ice and did not take any meds and was able to sleep well, but the soreness prevented him from doing his HEP. He reports no pain currently.    Patient is accompained by: Family member   Pertinent History Pt. has worked as a Technical sales engineerbuilding inspector for 10 years.  Pt. fell on 04/28/17 resulting in rib fracture/ pneumothorax/ L knee pes anserine bursitis/ R shoulder pain.    Pt. hoping to return to work with Light Duty status soon.  Pt. states he has to climb a ladder 1x/week and stair climb daily.     Limitations Standing;Sitting;Lifting;Walking;House hold activities   Patient Stated Goals Return to work/  Decrease back pain.   Currently in Pain? No/denies   Pain Onset --       Treatment:   Warm-up: SciFit L7 x 10 minutes B UE/LE position 4 (un-billed)    Ther-ex  Sled push/pull x 45' with 90# x 6 laps Standing Nautilus: Circuit 2 x 12 reps each with BUEs:  Pallof press kneeling on blu mat table 40# (chest press at 90 deg shoulder flexion) with 5 sec duration per rep (1 set performed to each side; pt reports some minor soreness in R min thoracic region)             Low Row (40#),             tricep pull-down (40#),              bicep curl (40#), (increased) (cues for slow controlled eccentric return; cues to keep breathing)      Total gym              Pallof press sitting at incline level 12 with chest press at approx 80 deg shoulder flexion with 3-5 sec hold x 10 reps each direction    Manual therapy: STM to R side mid thoracic paraspinals/intercostals in prone; increased muscle tone and trigger points noted around T8-9 paraspinals with tenderness in soft tissue Pt instructed in prone to sit with log roll technique to decrease pain with transition. Pt had no pain or muscle spasm with transition.   No pain following session.  PT Education - 06/29/17 1645    Education provided Yes   Education Details Exercise progression, normal soreness vs overworking   Person(s) Educated Patient   Methods Explanation;Demonstration;Tactile cues;Verbal cues   Comprehension Verbal cues required;Tactile cues required;Returned demonstration;Verbalized understanding             PT Long Term Goals - 06/11/17 2004      PT LONG TERM GOAL #1   Title Pt. will decrease MODI to <30% to improve pain-free mobility/ return to work.     Baseline MODI: 62% on 9/27   Time 4   Period Weeks   Status New   Target Date 07/06/17     PT LONG TERM GOAL #2   Title Pt. I with HEP to increase lumbar AROM to WNL (all planes) to improve pain-free mobility/ household tasks.    Baseline Lumbar AROM: flexion WNL/ extension 25% limited/ L and R rotn. 75% limited (pain)/ L and R lateral flexion 50% limited (R side pain).  B LE muscle strength grossly 5/5 MMT  exept knee flexion 4+/5 MMT (increase R side pain with resisted hip flexion L/R).    Time 4   Period Weeks   Status New   Target Date 07/06/17     PT LONG TERM GOAL #3   Title Pt. able to tolerate sidesleeping with no increase c/o sh./side pain to improve sleeping tolerance.    Baseline pain limited with sidesleeping at this time.    Time 4   Period Weeks   Status New   Target Date 07/06/17     PT LONG TERM GOAL #4   Title Pt. able to climb ladder with no c/o pain or limitations to promote return to work.     Baseline pain limited ladder climbing/ work tasks.    Time 4   Period Weeks   Status New   Target Date 07/06/17     PT LONG TERM GOAL #5   Title Pt. able to return to work with good body mechanics/ no increase c/o pain on a full-time basis.     Baseline pt. currently out of work since injury.    Time 4   Period Weeks   Status New   Target Date 07/06/17               Plan - 06/29/17 1657    Clinical Impression Statement Pt's spasms are not as bad in intensity or frequency as they used to be, per pt. Pt was able to perform standing circuit on Nautilus with increased weight today with proper technique and no increased pain. Pt had increased tone in R mid-thoracic paraspinals with tenderness. Pt will be returning to work on Monday on light duty. Pt had soreness after last session (likly due to lat pull-down exercise, which was designed to simulate work duty of pulling down ladder from attic.   Clinical Presentation Stable   Clinical Decision Making Moderate   Rehab Potential Good   PT Frequency 2x / week   PT Duration 4 weeks   PT Treatment/Interventions ADLs/Self Care Home Management;Aquatic Therapy;Cryotherapy;Moist Heat;Balance training;Therapeutic exercise;Therapeutic activities;Functional mobility training;Stair training;Gait training;Neuromuscular re-education;Patient/family education;Passive range of motion;Manual techniques   PT Next Visit Plan Continue  Nautilus and total gym resisted exercise progression, sled push/pull; STM to R paraspinals, Pallof press progression in kneeling and standing   PT Home Exercise Plan See HEP, no changes this visit   Consulted and Agree with Plan of Care Patient      Patient  will benefit from skilled therapeutic intervention in order to improve the following deficits and impairments:  Improper body mechanics, Pain, Decreased mobility, Decreased activity tolerance, Decreased range of motion, Decreased endurance, Decreased strength, Impaired UE functional use  Visit Diagnosis: Muscle weakness (generalized)  Acute pain of right shoulder  Acute right-sided low back pain without sciatica     Problem List Patient Active Problem List   Diagnosis Date Noted  . Pneumothorax, traumatic   . Multiple closed fractures of ribs of right side   . Fall   . Right pulmonary contusion   . Multiple rib fractures involving four or more ribs 04/28/2017    This entire session was performed under direct supervision and direction of a licensed therapist/therapist assistant . I have personally read, edited and approve of the note as written.   Seth Long, SPT Seth Long PT, DPT   06/30/2017, 11:48 AM  [facility] [address] [place], [place], [address] Phone: [phone]   Fax:  [phone]  Name: Seth Long MRN: [id number] Date of Birth: 08-18-1961

## 2017-06-30 ENCOUNTER — Ambulatory Visit: Payer: Worker's Compensation | Admitting: Physical Therapy

## 2017-07-03 ENCOUNTER — Encounter: Payer: Worker's Compensation | Admitting: Physical Therapy

## 2017-07-03 ENCOUNTER — Ambulatory Visit: Payer: Worker's Compensation | Admitting: Physical Therapy

## 2017-07-04 ENCOUNTER — Ambulatory Visit: Payer: Worker's Compensation | Admitting: Physical Therapy

## 2017-07-04 ENCOUNTER — Encounter: Payer: Self-pay | Admitting: Physical Therapy

## 2017-07-04 DIAGNOSIS — M545 Low back pain, unspecified: Secondary | ICD-10-CM

## 2017-07-04 DIAGNOSIS — M6281 Muscle weakness (generalized): Secondary | ICD-10-CM

## 2017-07-04 DIAGNOSIS — M25511 Pain in right shoulder: Secondary | ICD-10-CM

## 2017-07-04 NOTE — Therapy (Signed)
[facility] [address] [place], [place], [address] Phone: [phone]   Fax:  [phone]  Physical Therapy Treatment  Patient Details  Name: Seth Long MRN: [id number] Date of Birth: [birth year] Referring Provider: Dr. Kym Groom  Encounter Date: 07/04/2017      PT End of Session - 07/04/17 1617    Visit Number 8   Number of Visits 8   Date for PT Re-Evaluation 07/06/17   PT Start Time 2505   PT Stop Time 3976   PT Time Calculation (min) 48 min   Activity Tolerance Patient tolerated treatment well;No increased pain   Behavior During Therapy WFL for tasks assessed/performed      Past Medical History:  Diagnosis Date  . Coronary artery disease   . GERD (gastroesophageal reflux disease)   . Hemochromatosis   . HTN (hypertension)   . Mesenteric vein thrombosis     Past Surgical History:  Procedure Laterality Date  . CORONARY ANGIOPLASTY WITH STENT PLACEMENT      There were no vitals filed for this visit.      Subjective Assessment - 07/04/17 1603    Subjective Pt reports that he has returned to work this week. He has some increased tiredness and soreness in R thoracic region anteriorly and posteriorly.    Patient is accompained by: Family member   Pertinent History Pt. has worked as a Associate Professor for 10 years.  Pt. fell on 04/28/17 resulting in rib fracture/ pneumothorax/ L knee pes anserine bursitis/ R shoulder pain.    Pt. hoping to return to work with Light Duty status soon.  Pt. states he has to climb a ladder 1x/week and stair climb daily.     Limitations Standing;Sitting;Lifting;Walking;House hold activities   Patient Stated Goals Return to work/  Decrease back pain.   Currently in Pain? No/denies   Pain Onset More than a month ago      Treatment:   Goals re-assessed: See goals   Ther-ex             Sled push/pull x 45' with 90# x 6 laps Standing Nautilus: Circuit 2 x 15 reps each with  BUEs: Pallof press kneeling on blue mat table 40# (chest press at 90 deg shoulder flexion) with 5 sec duration per rep (1 set performed to each side x 10 reps              Low Row (40#)             tricep pull-down (40#)             bicep curl (40#), (increased) (Cues for slow controlled eccentric return; cues to keep breathing)                Manual therapy: Prone  Subscapular release bilaterally with mod muscle tightness noted, STM to interscapular musculature; trigger points/sensitivity noted Standing STM to upper thoracic paraspinals/intercostals in standing         PT Education - 07/04/17 1616    Education provided Yes   Education Details Ther-ex, core/shoulder girdle stability   Person(s) Educated Patient   Methods Explanation;Demonstration;Tactile cues;Verbal cues   Comprehension Returned demonstration;Verbalized understanding;Tactile cues required;Verbal cues required             PT Long Term Goals - 07/04/17 1628      PT LONG TERM GOAL #1   Title Pt. will decrease MODI to <30% to improve pain-free mobility/ return to work.  Baseline MODI: 62% on 9/27,  16% on 10/23   Time 4   Period Weeks   Status Achieved   Target Date 07/06/17     PT LONG TERM GOAL #2   Title Pt. I with HEP to increase lumbar AROM to WNL (all planes) to improve pain-free mobility/ household tasks.    Baseline Lumbar AROM: flexion WNL/ extension 25% limited/ L and R rotn. 75% limited (pain)/ L and R lateral flexion 50% limited (R side pain).  B LE muscle strength grossly 5/5 MMT exept knee flexion 4+/5 MMT (increase R side pain with resisted hip flexion L/R).    Time 4   Period Weeks   Status Partially Met   Target Date 08/01/17     PT LONG TERM GOAL #3   Title Pt. able to tolerate sidesleeping with no increase c/o sh./side pain to improve sleeping tolerance.    Baseline pain limited with sidesleeping at this time.    Time 4   Period Weeks   Status New     PT LONG TERM GOAL #4    Title Pt. able to climb ladder with no c/o pain or limitations to promote return to work.     Baseline Pt had transient pain 3/10 that resolved when not climbing   Time 4   Period Weeks   Status Partially Met   Target Date 08/01/17     PT LONG TERM GOAL #5   Title Pt. able to return to work with good body mechanics/ no increase c/o pain on a full-time basis.     Baseline Pt working full time with soreness following work   Time 4   Period Weeks   Status Partially Met   Target Date 08/01/17     Additional Long Term Goals   Additional Long Term Goals Yes     PT LONG TERM GOAL #6   Title Pt will be able to tolerate standing for 70% of his day in order to perform work duties.   Baseline On feet 50% of day   Time 4   Status New   Target Date 08/01/17               Plan - 07/04/17 1645    Clinical Impression Statement Pt was able to return to [employer] on Monday at full time on light duty status. Pt is having increased fatigue and soreness from prolonged sitting at work. He is unable to tolerate standing/walking or ladder climbing and overhead pulling activity required for normal duty at this time. Plan to begin integrating more overhead resisted exercise into routine. Plan to transition to 1 session/wk for 4 weeks to meet remaining goals.   Clinical Presentation Stable   Clinical Decision Making Moderate   Rehab Potential Good   PT Frequency 1x / week   PT Duration 4 weeks   PT Treatment/Interventions ADLs/Self Care Home Management;Aquatic Therapy;Cryotherapy;Moist Heat;Balance training;Therapeutic exercise;Therapeutic activities;Functional mobility training;Stair training;Gait training;Neuromuscular re-education;Patient/family education;Passive range of motion;Manual techniques   PT Next Visit Plan Advance HEP with overhead resisted exercise, wall push-ups; Continue Nautilus and total gym resisted exercise progression, sled push/pull; STM to R paraspinals, Pallof press progression in  kneeling and standing   PT Home Exercise Plan See HEP, no changes this visit   Consulted and Agree with Plan of Care Patient      Patient will benefit from skilled therapeutic intervention in order to improve the following deficits and impairments:  Improper body mechanics, Pain, Decreased mobility, Decreased activity  tolerance, Decreased range of motion, Decreased endurance, Decreased strength, Impaired UE functional use  Visit Diagnosis: Muscle weakness (generalized)  Acute pain of right shoulder  Acute right-sided low back pain without sciatica     Problem List Patient Active Problem List   Diagnosis Date Noted  . Pneumothorax, traumatic   . Multiple closed fractures of ribs of right side   . Fall   . Right pulmonary contusion   . Multiple rib fractures involving four or more ribs 04/28/2017   Khala Tarte Lenis Dickinson, SPT Pura Spice, PT, DPT # [phone] 07/05/2017, 6:55 PM  [facility] [address] [place], [place], [address] Phone: [phone]   Fax:  [phone]  Name: Seth Long MRN: [id number] Date of Birth: November 21, 1960

## 2017-07-06 ENCOUNTER — Ambulatory Visit: Payer: BLUE CROSS/BLUE SHIELD | Admitting: Physical Therapy

## 2017-07-13 ENCOUNTER — Ambulatory Visit: Payer: Worker's Compensation | Attending: Family Medicine | Admitting: Physical Therapy

## 2017-07-13 ENCOUNTER — Encounter: Payer: Self-pay | Admitting: Physical Therapy

## 2017-07-13 DIAGNOSIS — M545 Low back pain, unspecified: Secondary | ICD-10-CM

## 2017-07-13 DIAGNOSIS — M6281 Muscle weakness (generalized): Secondary | ICD-10-CM | POA: Insufficient documentation

## 2017-07-13 DIAGNOSIS — M25511 Pain in right shoulder: Secondary | ICD-10-CM | POA: Insufficient documentation

## 2017-07-13 NOTE — Therapy (Signed)
[facility] [address] [place], [place], [address] Phone: [phone]   Fax:  [phone]  Physical Therapy Treatment  Patient Details  Name: Seth Long MRN: [id number] Date of Birth: [birth year] Referring Provider: Dr. Kym Groom  Encounter Date: 07/13/2017      PT End of Session - 07/13/17 1707    Visit Number 9   Number of Visits 13   Date for PT Re-Evaluation 08/10/17   PT Start Time 1751   PT Stop Time 1703   PT Time Calculation (min) 57 min   Activity Tolerance Patient tolerated treatment well;No increased pain   Behavior During Therapy WFL for tasks assessed/performed      Past Medical History:  Diagnosis Date  . Coronary artery disease   . GERD (gastroesophageal reflux disease)   . Hemochromatosis   . HTN (hypertension)   . Mesenteric vein thrombosis     Past Surgical History:  Procedure Laterality Date  . CORONARY ANGIOPLASTY WITH STENT PLACEMENT      There were no vitals filed for this visit.      Subjective Assessment - 07/13/17 1628    Subjective Pt reports pain with work late in the evenings in posterior R thoracic area.    Patient is accompained by: Family member   Pertinent History Pt. has worked as a Associate Professor for 10 years.  Pt. fell on 04/28/17 resulting in rib fracture/ pneumothorax/ L knee pes anserine bursitis/ R shoulder pain.    Pt. hoping to return to work with Light Duty status soon.  Pt. states he has to climb a ladder 1x/week and stair climb daily.     Limitations Standing;Sitting;Lifting;Walking;House hold activities   Patient Stated Goals Return to work/  Decrease back pain.   Currently in Pain? No/denies   Pain Onset More than a month ago       Treatment:  Warm-up SciFit x 10 min BUE/BLE L7 (un-billed)  Ther-ex Standing Nautilus:Circuit 2x 20 reps each with BUEs: Pallof press kneeling on blue mat table 50# (chest press at 90 deg shoulder flexion)with 5 sec duration  per rep (1 set performed to each side x 10 reps  LowRow(50#) tricep pull-down(50#) bicep curl(40#), (increased) (Cues for slow controlled eccentric return; cues to keep breathing)   Supine:  Lumbar rotation 2 x 30 sec each direction Sitting:  Thoracic rotation with deep breathing at end range 2 x 30 sec (5 breaths) each direction.  HEP updated, see pt instructions        PT Education - 07/13/17 1707    Education provided Yes   Education Details HEP advanced   Person(s) Educated Patient   Methods Explanation;Demonstration;Tactile cues;Verbal cues;Handout   Comprehension Tactile cues required;Verbal cues required;Returned demonstration;Verbalized understanding             PT Long Term Goals - 07/04/17 1628      PT LONG TERM GOAL #1   Title Pt. will decrease MODI to <30% to improve pain-free mobility/ return to work.     Baseline MODI: 62% on 9/27,  16% on 10/23   Time 4   Period Weeks   Status Achieved   Target Date 07/06/17     PT LONG TERM GOAL #2   Title Pt. I with HEP to increase lumbar AROM to WNL (all planes) to improve pain-free mobility/ household tasks.    Baseline Lumbar AROM: flexion WNL/ extension 25% limited/ L and R rotn. 75% limited (pain)/ L and R lateral  flexion 50% limited (R side pain).  B LE muscle strength grossly 5/5 MMT exept knee flexion 4+/5 MMT (increase R side pain with resisted hip flexion L/R).    Time 4   Period Weeks   Status Partially Met   Target Date 08/01/17     PT LONG TERM GOAL #3   Title Pt. able to tolerate sidesleeping with no increase c/o sh./side pain to improve sleeping tolerance.    Baseline pain limited with sidesleeping at this time.    Time 4   Period Weeks   Status New     PT LONG TERM GOAL #4   Title Pt. able to climb ladder with no c/o pain or limitations to promote return to work.     Baseline Pt had transient pain 3/10 that resolved when not climbing   Time 4   Period  Weeks   Status Partially Met   Target Date 08/01/17     PT LONG TERM GOAL #5   Title Pt. able to return to work with good body mechanics/ no increase c/o pain on a full-time basis.     Baseline Pt working full time with soreness following work   Time 4   Period Weeks   Status Partially Met   Target Date 08/01/17     Additional Long Term Goals   Additional Long Term Goals Yes     PT LONG TERM GOAL #6   Title Pt will be able to tolerate standing for 70% of his day in order to perform work duties.   Baseline On feet 50% of day   Time 4   Status New   Target Date 08/01/17               Plan - 07/13/17 1708    Clinical Impression Statement Pt had no increased pain during session. 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 He is not sleeping very well, about 4 hour per night due to burning sensation below R scapula. HEP updated with thoracic and lumbar rotation stretches.   Clinical Presentation Stable   Clinical Decision Making Moderate   Rehab Potential Good   PT Frequency 1x / week   PT Duration 4 weeks   PT Treatment/Interventions ADLs/Self Care Home Management;Aquatic Therapy;Cryotherapy;Moist Heat;Balance training;Therapeutic exercise;Therapeutic activities;Functional mobility training;Stair training;Gait training;Neuromuscular re-education;Patient/family education;Passive range of motion;Manual techniques   PT Next Visit Plan  continued overhead resisted exercise, wall push-ups; Nautilus and total gym resisted exercise progression, sled push/pull; STM to R paraspinals, Pallof press progression in kneeling and standing   PT Home Exercise Plan Advance HEP with thoracic and lumbar rotation stretches,   Consulted and Agree with Plan of Care Patient      Patient will benefit from skilled therapeutic intervention in order to improve the following deficits and impairments:  Improper body mechanics, Pain, Decreased mobility, Decreased  activity tolerance, Decreased range of motion, Decreased endurance, Decreased strength, Impaired UE functional use  Visit Diagnosis: Muscle weakness (generalized)  Acute pain of right shoulder  Acute right-sided low back pain without sciatica     Problem List Patient Active Problem List   Diagnosis Date Noted  . Pneumothorax, traumatic   . Multiple closed fractures of ribs of right side   . Fall   . Right pulmonary contusion   . Multiple rib fractures involving four or more ribs 04/28/2017   Frankie Zito M Jayzen Paver, SPT Pura Spice, PT, DPT # [phone] 07/14/2017, 7:59  AM  [facility] [address] [place], [place], [address] Phone: [phone]   Fax:  [phone]  Name: Seth Long MRN: [id number] Date of Birth: 09-21-1960

## 2017-07-19 ENCOUNTER — Encounter: Payer: Self-pay | Admitting: Physical Therapy

## 2017-07-19 ENCOUNTER — Ambulatory Visit: Payer: Worker's Compensation | Admitting: Physical Therapy

## 2017-07-19 DIAGNOSIS — M6281 Muscle weakness (generalized): Secondary | ICD-10-CM | POA: Diagnosis not present

## 2017-07-19 DIAGNOSIS — M545 Low back pain, unspecified: Secondary | ICD-10-CM

## 2017-07-19 DIAGNOSIS — M25511 Pain in right shoulder: Secondary | ICD-10-CM

## 2017-07-19 NOTE — Therapy (Signed)
[facility] [address] [place], [place], [address] Phone: [phone]   Fax:  [phone]  Physical Therapy Treatment  Patient Details  Name: Seth Long MRN: [id number] Date of Birth: May 19, 1961 Referring Provider: Dr. Kym Groom   Encounter Date: 07/19/2017  PT End of Session - 07/19/17 1600    Visit Number  10    Number of Visits  13    Date for PT Re-Evaluation  08/10/17    PT Start Time  2992    PT Stop Time  4268    PT Time Calculation (min)  49 min    Activity Tolerance  Patient tolerated treatment well;No increased pain    Behavior During Therapy  WFL for tasks assessed/performed       Past Medical History:  Diagnosis Date  . Coronary artery disease   . GERD (gastroesophageal reflux disease)   . Hemochromatosis   . HTN (hypertension)   . Mesenteric vein thrombosis     Past Surgical History:  Procedure Laterality Date  . CORONARY ANGIOPLASTY WITH STENT PLACEMENT      There were no vitals filed for this visit.  Subjective Assessment - 07/19/17 1557    Subjective  Pt reports that he feels he is getting better, but still has soreness at night. Pt reports no pain currently.     Patient is accompained by:  Family member    Pertinent History  Pt. has worked as a Associate Professor for 10 years.  Pt. fell on 04/28/17 resulting in rib fracture/ pneumothorax/ L knee pes anserine bursitis/ R shoulder pain.    Pt. hoping to return to work with Light Duty status soon.  Pt. states he has to climb a ladder 1x/week and stair climb daily.      Limitations  Standing;Sitting;Lifting;Walking;House hold activities    Patient Stated Goals  Return to work/  Decrease back pain.    Currently in Pain?  No/denies    Pain Onset  More than a month ago        Treatment:   Warm-up SciFit x 8 min BUE/BLE L7 (un-billed)   Ther-ex  Standing at // bars:    Shoulder push-ups 2 x 20  Standing Nautilus: Circuit 2 x 20 reps each with BUEs:   50#  Pallof press each side x 10 reps               50# Low Row              50# tricep pull-down              40# Chest press    20# D1, D2 extension   (Cues for slow controlled eccentric return; cues to keep breathing)    HEP updated with D1, D2 band resisted UE flexion, see pt instructions    PT Education - 07/19/17 1559    Education provided  Yes    Education Details  Exercise technique    Person(s) Educated  Patient    Methods  Explanation;Demonstration;Tactile cues;Verbal cues    Comprehension  Verbal cues required;Returned demonstration;Verbalized understanding;Tactile cues required          PT Long Term Goals - 07/04/17 1628      PT LONG TERM GOAL #1   Title  Pt. will decrease MODI to <30% to improve pain-free mobility/ return to work.      Baseline  MODI: 62% on 9/27,  16% on 10/23    Time  4  Period  Weeks    Status  Achieved    Target Date  07/06/17      PT LONG TERM GOAL #2   Title  Pt. I with HEP to increase lumbar AROM to WNL (all planes) to improve pain-free mobility/ household tasks.     Baseline  Lumbar AROM: flexion WNL/ extension 25% limited/ L and R rotn. 75% limited (pain)/ L and R lateral flexion 50% limited (R side pain).  B LE muscle strength grossly 5/5 MMT exept knee flexion 4+/5 MMT (increase R side pain with resisted hip flexion L/R).     Time  4    Period  Weeks    Status  Partially Met    Target Date  08/01/17      PT LONG TERM GOAL #3   Title  Pt. able to tolerate sidesleeping with no increase c/o sh./side pain to improve sleeping tolerance.     Baseline  pain limited with sidesleeping at this time.     Time  4    Period  Weeks    Status  New      PT LONG TERM GOAL #4   Title  Pt. able to climb ladder with no c/o pain or limitations to promote return to work.      Baseline  Pt had transient pain 3/10 that resolved when not climbing    Time  4    Period  Weeks    Status  Partially Met    Target Date  08/01/17      PT LONG TERM  GOAL #5   Title  Pt. able to return to work with good body mechanics/ no increase c/o pain on a full-time basis.      Baseline  Pt working full time with soreness following work    Time  4    Period  Weeks    Status  Partially Met    Target Date  08/01/17      Additional Long Term Goals   Additional Long Term Goals  Yes      PT LONG TERM GOAL #6   Title  Pt will be able to tolerate standing for 70% of his day in order to perform work duties.    Baseline  On feet 50% of day    Time  4    Status  New    Target Date  08/01/17            Plan - 07/19/17 1644    Clinical Impression Statement  Pt is progressing with less pain at work and decreased difficulty with prolonged standing and sitting tasks at work. Pt still has mild discomfort with burning sensation in a dermatomal pattern on R at about T12 when performing lifting, pulling, and twisting tasks. Pt led in UE D1 and D2 diagonal extension with no pain reported. Pt is still on light duty at work, but is concerned about having to pull down ladders from the ceiling in the future, which are about 40#. Plan to continue to progress pt into progressive overhead pulling exercises.    Clinical Presentation  Stable    Clinical Decision Making  Moderate    Rehab Potential  Good    PT Frequency  1x / week    PT Duration  4 weeks    PT Treatment/Interventions  ADLs/Self Care Home Management;Aquatic Therapy;Cryotherapy;Moist Heat;Balance training;Therapeutic exercise;Therapeutic activities;Functional mobility training;Stair training;Gait training;Neuromuscular re-education;Patient/family education;Passive range of motion;Manual techniques    PT Next Visit Plan  continued overhead resisted exercise, wall push-ups; Nautilus and total gym resisted exercise progression, sled push/pull; STM to R paraspinals, Pallof press progression in kneeling and standing    PT Home Exercise Plan  Advance HEP with band resisted UE diagonal flexion D1, D2. Continued  thoracic and lumbar rotation stretches,    Consulted and Agree with Plan of Care  Patient       Patient will benefit from skilled therapeutic intervention in order to improve the following deficits and impairments:  Improper body mechanics, Pain, Decreased mobility, Decreased activity tolerance, Decreased range of motion, Decreased endurance, Decreased strength, Impaired UE functional use  Visit Diagnosis: Muscle weakness (generalized)  Acute pain of right shoulder  Acute right-sided low back pain without sciatica     Problem List Patient Active Problem List   Diagnosis Date Noted  . Pneumothorax, traumatic   . Multiple closed fractures of ribs of right side   . Fall   . Right pulmonary contusion   . Multiple rib fractures involving four or more ribs 04/28/2017   Devan Lenis Dickinson, SPT Pura Spice, PT, DPT # [phone] 07/19/2017, 5:27 PM  [facility] [address] [place], [place], [address] Phone: [phone]   Fax:  [phone]  Name: Seth Long MRN: [id number] Date of Birth: Dec 23, 1960

## 2017-07-26 ENCOUNTER — Ambulatory Visit: Payer: Worker's Compensation | Admitting: Physical Therapy

## 2017-07-26 ENCOUNTER — Encounter: Payer: Self-pay | Admitting: Physical Therapy

## 2017-07-26 DIAGNOSIS — M6281 Muscle weakness (generalized): Secondary | ICD-10-CM | POA: Diagnosis not present

## 2017-07-26 DIAGNOSIS — M545 Low back pain, unspecified: Secondary | ICD-10-CM

## 2017-07-26 DIAGNOSIS — M25511 Pain in right shoulder: Secondary | ICD-10-CM

## 2017-07-26 NOTE — Therapy (Signed)
[facility] [address] [place], [place], [address] Phone: [phone]   Fax:  [phone]  Physical Therapy Treatment  Patient Details  Name: Seth Long MRN: [id number] Date of Birth: 27-Feb-1961 Referring Provider: Dr. Kym Groom   Encounter Date: 07/26/2017  PT End of Session - 07/26/17 1658    Visit Number  11    Number of Visits  13    Date for PT Re-Evaluation  08/10/17    PT Start Time  7412    PT Stop Time  1711    PT Time Calculation (min)  54 min    Activity Tolerance  Patient tolerated treatment well;No increased pain    Behavior During Therapy  WFL for tasks assessed/performed       Past Medical History:  Diagnosis Date  . Coronary artery disease   . GERD (gastroesophageal reflux disease)   . Hemochromatosis   . HTN (hypertension)   . Mesenteric vein thrombosis     Past Surgical History:  Procedure Laterality Date  . CORONARY ANGIOPLASTY WITH STENT PLACEMENT      There were no vitals filed for this visit.  Subjective Assessment - 07/26/17 1619    Subjective  Pt reports that during the day he does well; he walked over a mile tody withot any issue. Pt had office visit today to review recent chest CT.    Patient is accompained by:  Family member    Pertinent History  Pt. has worked as a Associate Professor for 10 years.  Pt. fell on 04/28/17 resulting in rib fracture/ pneumothorax/ L knee pes anserine bursitis/ R shoulder pain.    Pt. hoping to return to work with Light Duty status soon.  Pt. states he has to climb a ladder 1x/week and stair climb daily.      Limitations  Standing;Sitting;Lifting;Walking;House hold activities    Patient Stated Goals  Return to work/  Decrease back pain.    Currently in Pain?  No/denies    Pain Onset  More than a month ago       Treatment:  Warm-up SciFit x 10 min BUE/BLE L7 (un-billed)  Ther-ex             Standing at // bars:            Shoulder push-ups 1 x 20    Standing Nautilus:Circuit 2x20reps each with BUEs:  50#  Pallof press each sidex 12 reps   50# LowRow    60# lat pull-down  50# tricep pull-down(reduced to 40# at 12 reps due to muscle fatigue) 50# Chest press            30# D1, D2 extension  (Cues for slow controlled eccentric return; cues to keep breathing)    PT Education - 07/26/17 1639    Education provided  Yes    Education Details  Plan of care, exercise technique    Person(s) Educated  Patient    Methods  Explanation;Tactile cues;Demonstration;Verbal cues    Comprehension  Verbal cues required;Returned demonstration;Verbalized understanding;Tactile cues required          PT Long Term Goals - 07/04/17 1628      PT LONG TERM GOAL #1   Title  Pt. will decrease MODI to <30% to improve pain-free mobility/ return to work.      Baseline  MODI: 62% on 9/27,  16% on 10/23    Time  4    Period  Weeks  Status  Achieved    Target Date  07/06/17      PT LONG TERM GOAL #2   Title  Pt. I with HEP to increase lumbar AROM to WNL (all planes) to improve pain-free mobility/ household tasks.     Baseline  Lumbar AROM: flexion WNL/ extension 25% limited/ L and R rotn. 75% limited (pain)/ L and R lateral flexion 50% limited (R side pain).  B LE muscle strength grossly 5/5 MMT exept knee flexion 4+/5 MMT (increase R side pain with resisted hip flexion L/R).     Time  4    Period  Weeks    Status  Partially Met    Target Date  08/01/17      PT LONG TERM GOAL #3   Title  Pt. able to tolerate sidesleeping with no increase c/o sh./side pain to improve sleeping tolerance.     Baseline  pain limited with sidesleeping at this time.     Time  4    Period  Weeks    Status  New      PT LONG TERM GOAL #4   Title  Pt. able to climb ladder with no c/o pain or limitations to promote return to work.      Baseline  Pt had transient pain 3/10 that resolved when not climbing    Time  4    Period   Weeks    Status  Partially Met    Target Date  08/01/17      PT LONG TERM GOAL #5   Title  Pt. able to return to work with good body mechanics/ no increase c/o pain on a full-time basis.      Baseline  Pt working full time with soreness following work    Time  4    Period  Weeks    Status  Partially Met    Target Date  08/01/17      Additional Long Term Goals   Additional Long Term Goals  Yes      PT LONG TERM GOAL #6   Title  Pt will be able to tolerate standing for 70% of his day in order to perform work duties.    Baseline  On feet 50% of day    Time  4    Status  New    Target Date  08/01/17         Plan - 07/26/17 1700    Clinical Impression Statement  Recent CT showed healing right sided ribfractures, and stable pulmonary nodules in L upper lobe. Pt will follow-up in one year to monitor nodules. Pt is progressing with activity tolerance at work. He was able to walk over a mile today without increased symptoms. After last session he had soreness that lasted several hours. Pt had no increased pain with ther-ex.    Clinical Presentation  Stable    Clinical Decision Making  Moderate    Rehab Potential  Good    PT Frequency  1x / week    PT Duration  4 weeks    PT Treatment/Interventions  ADLs/Self Care Home Management;Aquatic Therapy;Cryotherapy;Moist Heat;Balance training;Therapeutic exercise;Therapeutic activities;Functional mobility training;Stair training;Gait training;Neuromuscular re-education;Patient/family education;Passive range of motion;Manual techniques    PT Next Visit Plan  Continue progressing pt to independnece with gym and HEP; overhead resisted exercise, wall push-ups; Nautilus and total gym resisted exercise progression, sled push/pull; STM to R paraspinals, Pallof press progression in kneeling and standing    PT Home Exercise Plan  Continued HEP with band resisted UE diagonal flexion D1, D2. thoracic and lumbar rotation stretches,    Consulted and Agree with  Plan of Care  Patient       Patient will benefit from skilled therapeutic intervention in order to improve the following deficits and impairments:  Improper body mechanics, Pain, Decreased mobility, Decreased activity tolerance, Decreased range of motion, Decreased endurance, Decreased strength, Impaired UE functional use  Visit Diagnosis: Muscle weakness (generalized)  Acute pain of right shoulder  Acute right-sided low back pain without sciatica     Problem List Patient Active Problem List   Diagnosis Date Noted  . Pneumothorax, traumatic   . Multiple closed fractures of ribs of right side   . Fall   . Right pulmonary contusion   . Multiple rib fractures involving four or more ribs 04/28/2017   Jhostin Epps M Elliet Goodnow, SPT Pura Spice, PT, DPT # [phone] 07/27/2017, 8:11 AM  [facility] [place] [address] [place], [place], [address] Phone: [phone]   Fax:  [phone]  Name: Seth Long MRN: [id number] Date of Birth: 23-Mar-1961

## 2017-08-01 ENCOUNTER — Encounter: Payer: Self-pay | Admitting: Physical Therapy

## 2017-08-01 ENCOUNTER — Ambulatory Visit: Payer: Worker's Compensation | Admitting: Physical Therapy

## 2017-08-01 DIAGNOSIS — M6281 Muscle weakness (generalized): Secondary | ICD-10-CM | POA: Diagnosis not present

## 2017-08-01 DIAGNOSIS — M545 Low back pain, unspecified: Secondary | ICD-10-CM

## 2017-08-01 DIAGNOSIS — M25511 Pain in right shoulder: Secondary | ICD-10-CM

## 2017-08-01 NOTE — Therapy (Signed)
[facility] [address] [place], [place], [address] Phone: [phone]   Fax:  [phone]  Physical Therapy Treatment  Patient Details  Name: Seth Long MRN: [id number] Date of Birth: 01-Mar-1961 Referring Provider: Dr. Kym Groom   Encounter Date: 08/01/2017  PT End of Session - 08/01/17 1602    Visit Number  12    Number of Visits  13    Date for PT Re-Evaluation  08/10/17    PT Start Time  5809    PT Stop Time  9833    PT Time Calculation (min)  55 min    Activity Tolerance  Patient tolerated treatment well;No increased pain    Behavior During Therapy  WFL for tasks assessed/performed       Past Medical History:  Diagnosis Date  . Coronary artery disease   . GERD (gastroesophageal reflux disease)   . Hemochromatosis   . HTN (hypertension)   . Mesenteric vein thrombosis     Past Surgical History:  Procedure Laterality Date  . CORONARY ANGIOPLASTY WITH STENT PLACEMENT      There were no vitals filed for this visit.  Subjective Assessment - 08/01/17 1558    Subjective  Pt reports that he hass been doing his HEP with no issues. He feels that he is getting stringer. Pt has no pain or soreness currently.    Patient is accompained by:  Family member    Pertinent History  Pt. has worked as a Associate Professor for 10 years.  Pt. fell on 04/28/17 resulting in rib fracture/ pneumothorax/ L knee pes anserine bursitis/ R shoulder pain.    Pt. hoping to return to work with Light Duty status soon.  Pt. states he has to climb a ladder 1x/week and stair climb daily.      Limitations  Standing;Sitting;Lifting;Walking;House hold activities    Patient Stated Goals  Return to work/  Decrease back pain.    Currently in Pain?  No/denies    Pain Onset  More than a month ago        Pt is improving. He is having less pain with activity in therapy and at work. Pt is still not doing any crawling in crawlspaces at work due to R sided  thoracic pain. Pt also is not climbing ladders at work currently. Pt is able to perform all other work duties. Pt reports he is sleeping better with decreased L shoulder pain. D/C next session.     Treatment:  Ther-ex  Standing at // bars:   Shoulder push-ups 1 x 20    Standing Nautilus:Circuit 2x20reps each with BUEs: 50# Pallof press each sidex 15 reps  50# LowRowx 16 (pt fatigued and reports he cannot due 20 reps)            60# lat pull-down x 20 40# tricep pull-downx 20  50# Chest press 20   30# D1, D2 extensionx 20 each side  (Cues for slow controlled eccentric return; cues to keep breathing; verbal and tactile cues for isolation of movement and correct technique)   Cool down on SciFit x 10 min L 7 BUE/BLE (un-billed)     PT Education - 08/01/17 1818    Education provided  Yes    Education Details  Exercise technique    Person(s) Educated  Patient    Methods  Explanation;Demonstration;Tactile cues;Verbal cues    Comprehension  Verbalized understanding;Returned demonstration;Verbal cues required;Tactile cues required  PT Long Term Goals - 07/04/17 1628      PT LONG TERM GOAL #1   Title  Pt. will decrease MODI to <30% to improve pain-free mobility/ return to work.      Baseline  MODI: 62% on 9/27,  16% on 10/23    Time  4    Period  Weeks    Status  Achieved    Target Date  07/06/17      PT LONG TERM GOAL #2   Title  Pt. I with HEP to increase lumbar AROM to WNL (all planes) to improve pain-free mobility/ household tasks.     Baseline  Lumbar AROM: flexion WNL/ extension 25% limited/ L and R rotn. 75% limited (pain)/ L and R lateral flexion 50% limited (R side pain).  B LE muscle strength grossly 5/5 MMT exept knee flexion 4+/5 MMT (increase R side pain with resisted hip flexion L/R).     Time  4    Period  Weeks    Status  Partially Met    Target Date  08/01/17      PT LONG TERM GOAL  #3   Title  Pt. able to tolerate sidesleeping with no increase c/o sh./side pain to improve sleeping tolerance.     Baseline  pain limited with sidesleeping at this time.     Time  4    Period  Weeks    Status  New      PT LONG TERM GOAL #4   Title  Pt. able to climb ladder with no c/o pain or limitations to promote return to work.      Baseline  Pt had transient pain 3/10 that resolved when not climbing    Time  4    Period  Weeks    Status  Partially Met    Target Date  08/01/17      PT LONG TERM GOAL #5   Title  Pt. able to return to work with good body mechanics/ no increase c/o pain on a full-time basis.      Baseline  Pt working full time with soreness following work    Time  4    Period  Weeks    Status  Partially Met    Target Date  08/01/17      Additional Long Term Goals   Additional Long Term Goals  Yes      PT LONG TERM GOAL #6   Title  Pt will be able to tolerate standing for 70% of his day in order to perform work duties.    Baseline  On feet 50% of day    Time  4    Status  New    Target Date  08/01/17            Plan - 08/01/17 1818    Clinical Impression Statement  Pt is improving. He is having less pain with activity in therapy and at work. Pt is still not doing any crawling in crawlspaces at work due to R sided thoracic pain. Pt also is not climbing ladders at work currently. Pt is able to perform all other work duties. Pt reports he is sleeping better with decreased L shoulder pain. D/C next session.     Clinical Presentation  Stable    Clinical Decision Making  Moderate    Rehab Potential  Good    PT Frequency  1x / week    PT Duration  4 weeks  PT Treatment/Interventions  ADLs/Self Care Home Management;Aquatic Therapy;Cryotherapy;Moist Heat;Balance training;Therapeutic exercise;Therapeutic activities;Functional mobility training;Stair training;Gait training;Neuromuscular re-education;Patient/family education;Passive range of motion;Manual  techniques    PT Next Visit Plan  D/C Continue progressing pt to independnece with gym and HEP; overhead resisted exercise, wall push-ups; Nautilus and total gym resisted exercise progression, sled push/pull; STM to R paraspinals, Pallof press progression in kneeling and standing    PT Home Exercise Plan  Continued HEP with band resisted UE diagonal flexion D1, D2. thoracic and lumbar rotation stretches,    Consulted and Agree with Plan of Care  Patient       Patient will benefit from skilled therapeutic intervention in order to improve the following deficits and impairments:  Improper body mechanics, Pain, Decreased mobility, Decreased activity tolerance, Decreased range of motion, Decreased endurance, Decreased strength, Impaired UE functional use  Visit Diagnosis: Muscle weakness (generalized)  Acute pain of right shoulder  Acute right-sided low back pain without sciatica     Problem List Patient Active Problem List   Diagnosis Date Noted  . Pneumothorax, traumatic   . Multiple closed fractures of ribs of right side   . Fall   . Right pulmonary contusion   . Multiple rib fractures involving four or more ribs 04/28/2017   Pura Spice, PT, DPT # [phone] 08/02/2017, 8:34 AM  [facility] [address] [place], [place], [address] Phone: [phone]   Fax:  [phone]  Name: RUBE Widjaja MRN: [id number] Date of Birth: 1960/12/14

## 2017-08-02 ENCOUNTER — Encounter: Payer: Self-pay | Admitting: Physical Therapy

## 2017-10-24 DIAGNOSIS — M545 Low back pain, unspecified: Secondary | ICD-10-CM | POA: Insufficient documentation

## 2017-10-24 DIAGNOSIS — S161XXA Strain of muscle, fascia and tendon at neck level, initial encounter: Secondary | ICD-10-CM | POA: Insufficient documentation

## 2017-10-24 DIAGNOSIS — M542 Cervicalgia: Secondary | ICD-10-CM | POA: Insufficient documentation

## 2017-10-24 DIAGNOSIS — R0781 Pleurodynia: Secondary | ICD-10-CM | POA: Insufficient documentation

## 2017-10-24 DIAGNOSIS — S39012A Strain of muscle, fascia and tendon of lower back, initial encounter: Secondary | ICD-10-CM | POA: Insufficient documentation

## 2017-10-25 DIAGNOSIS — S7001XA Contusion of right hip, initial encounter: Secondary | ICD-10-CM | POA: Insufficient documentation

## 2017-11-06 DIAGNOSIS — G4733 Obstructive sleep apnea (adult) (pediatric): Secondary | ICD-10-CM | POA: Insufficient documentation

## 2017-12-12 IMAGING — CR DG CHEST 2V
2 series · 3 of 3 positions shown · non-contrast
Comparison: CT chest 04/28/2017 and chest radiograph 04/28/2017.

CLINICAL DATA: Multiple rib fractures after a fall, subsequent
encounter.

EXAM:
CHEST  2 VIEW

[Series 1: chest pa · 0.14mm/px · 2 of 2 slices shown]
[im 1/2]
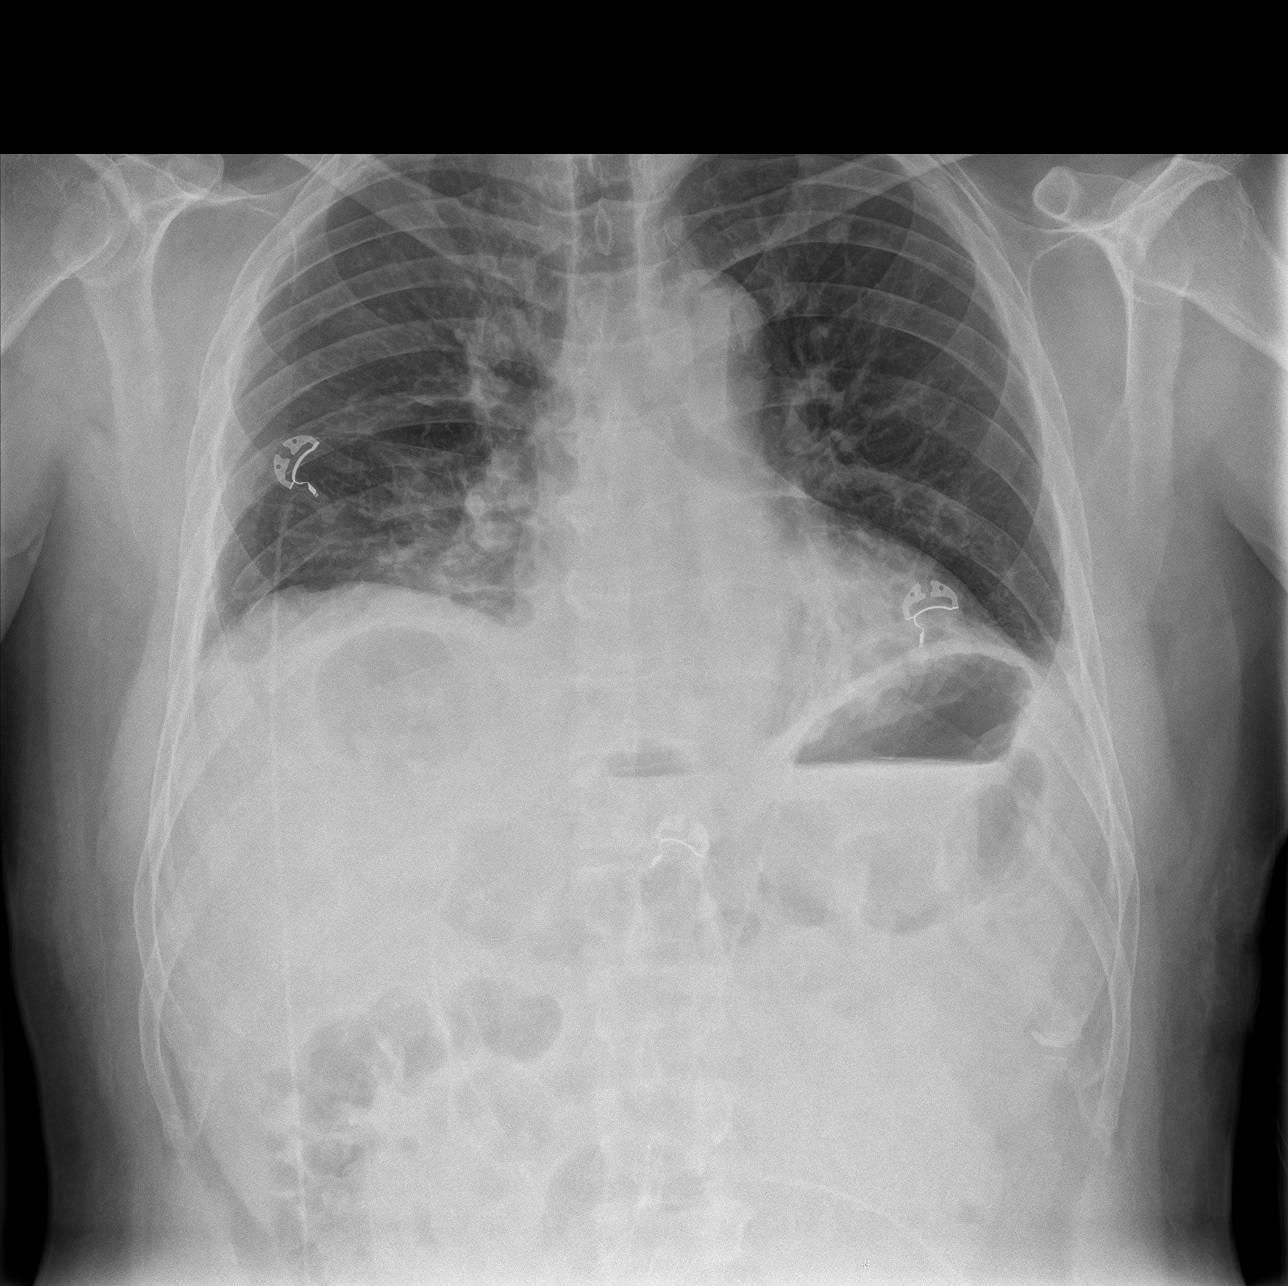
[im 2/2]
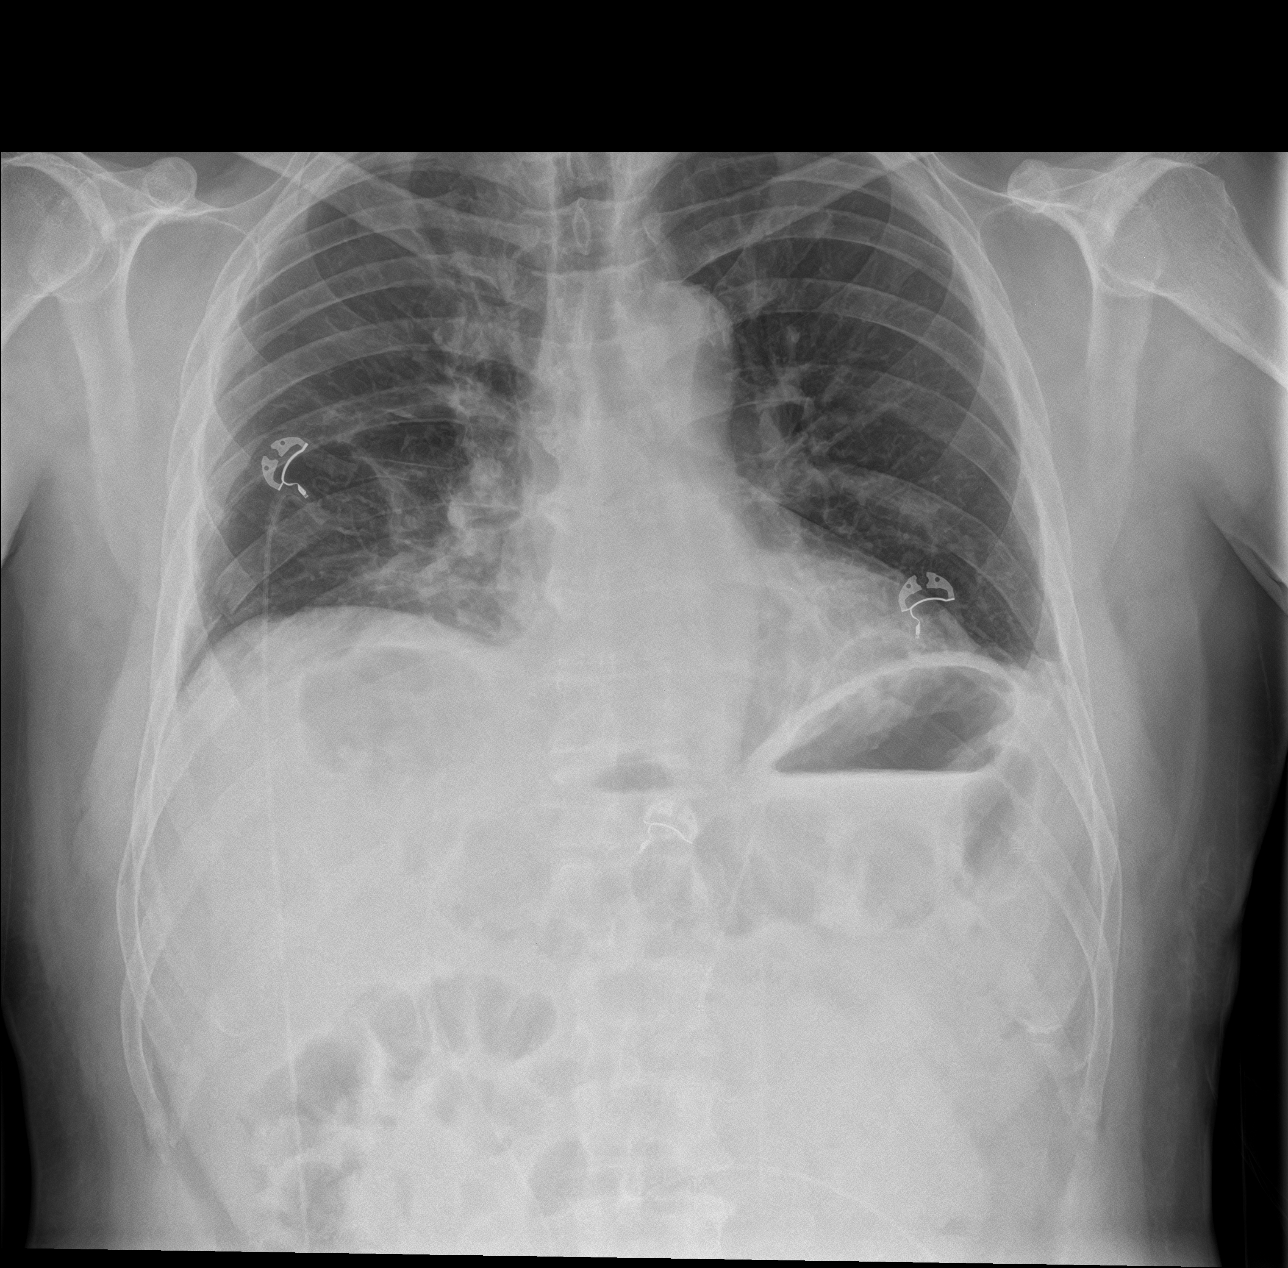

[chest lat]
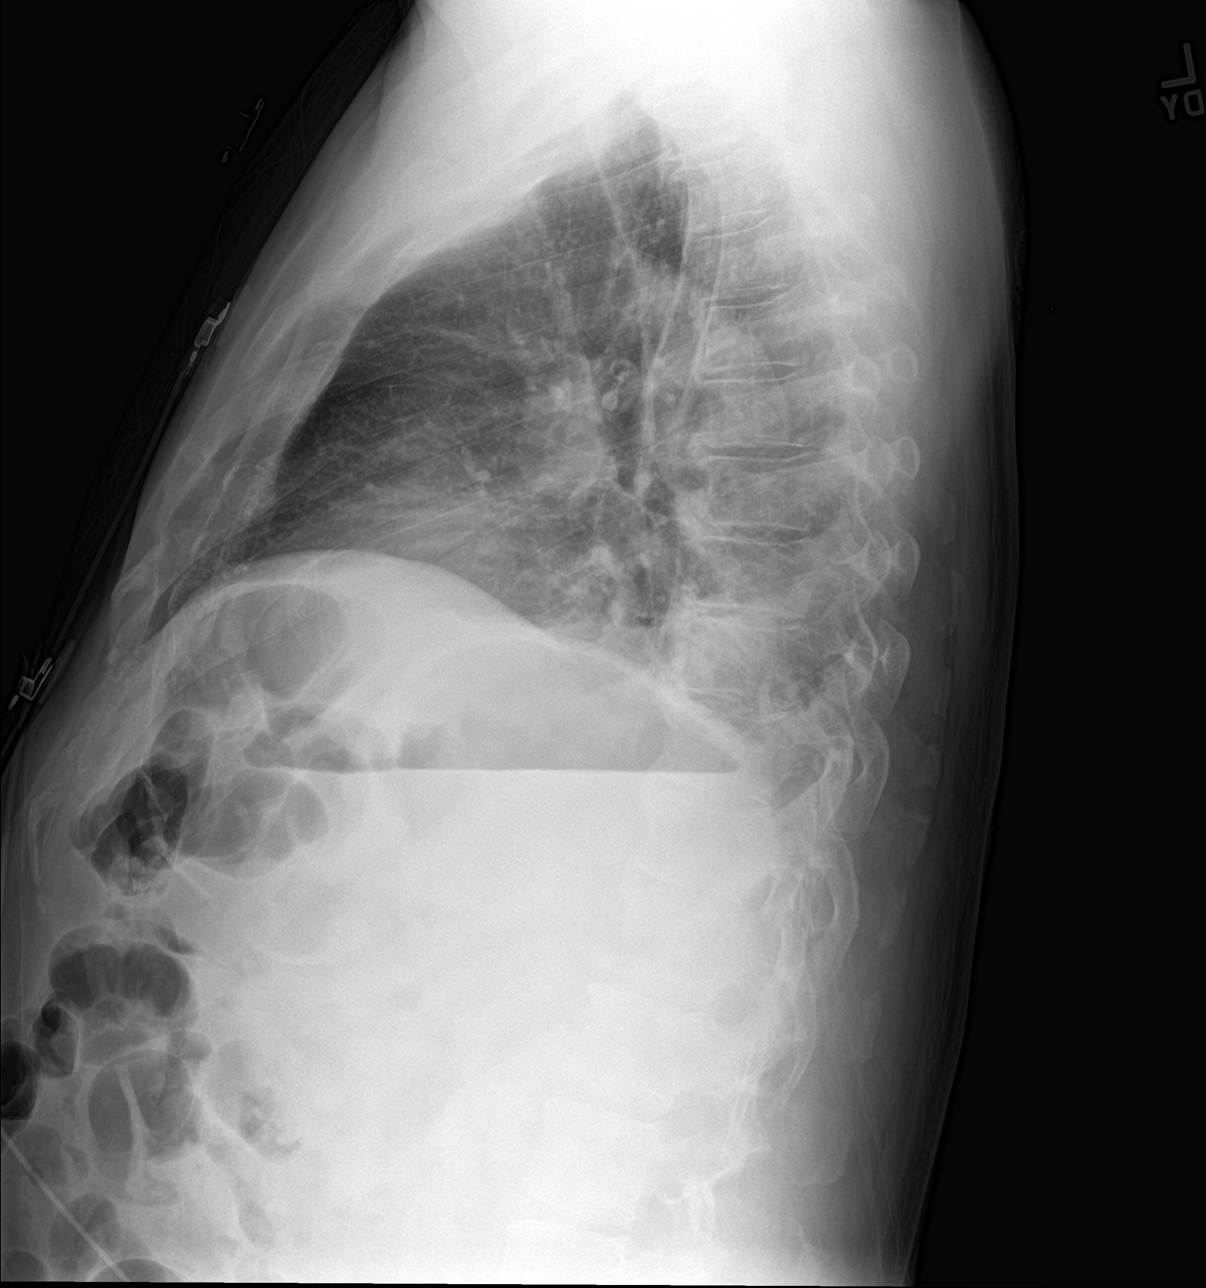

[3 of 3 positions shown; findings below may reference images not displayed]

FINDINGS: Trachea is midline. Heart is enlarged, stable. Lungs are low in
volume with bibasilar atelectasis. Small left upper lobe nodular
density again noted. No pleural fluid. There may be trace residual
anterior pleural air on the lateral view. Acute displaced right rib
fractures. Visualized upper abdomen is unremarkable.
IMPRESSION: 1. Suspect trace residual anterior right pneumothorax.
2. Bibasilar atelectasis.
3. Displaced right rib fractures.
4. Left upper lobe nodular density, as on yesterday's CT. Given
relatively small size, follow-up CT chest in 3 months is
recommended. This recommendation follows the consensus statement:
Guidelines for Management of Small Pulmonary Nodules Detected on CT

## 2018-05-21 DIAGNOSIS — S27321A Contusion of lung, unilateral, initial encounter: Secondary | ICD-10-CM

## 2018-05-21 DIAGNOSIS — S2241XA Multiple fractures of ribs, right side, initial encounter for closed fracture: Secondary | ICD-10-CM

## 2018-05-21 DIAGNOSIS — S270XXA Traumatic pneumothorax, initial encounter: Secondary | ICD-10-CM

## 2019-01-04 ENCOUNTER — Other Ambulatory Visit: Payer: Self-pay

## 2019-01-05 LAB — IRON AND TIBC
Iron Saturation: 42 % (ref 15–55)
Iron: 123 ug/dL (ref 38–169)
Total Iron Binding Capacity: 292 ug/dL (ref 250–450)
UIBC: 169 ug/dL (ref 111–343)

## 2019-01-05 LAB — FERRITIN: Ferritin: 60 ng/mL (ref 30–400)

## 2019-01-25 DIAGNOSIS — N410 Acute prostatitis: Secondary | ICD-10-CM | POA: Insufficient documentation

## 2019-02-05 ENCOUNTER — Other Ambulatory Visit: Payer: Self-pay

## 2019-02-06 LAB — CBC WITH DIFFERENTIAL/PLATELET
Basophils Absolute: 0 10*3/uL (ref 0.0–0.2)
Basos: 0 %
EOS (ABSOLUTE): 0.3 10*3/uL (ref 0.0–0.4)
Eos: 6 %
Hematocrit: 41 % (ref 37.5–51.0)
Hemoglobin: 14.3 g/dL (ref 13.0–17.7)
Immature Grans (Abs): 0 10*3/uL (ref 0.0–0.1)
Immature Granulocytes: 0 %
Lymphocytes Absolute: 1.8 10*3/uL (ref 0.7–3.1)
Lymphs: 36 %
MCH: 32.2 pg (ref 26.6–33.0)
MCHC: 34.9 g/dL (ref 31.5–35.7)
MCV: 92 fL (ref 79–97)
Monocytes Absolute: 0.6 10*3/uL (ref 0.1–0.9)
Monocytes: 12 %
Neutrophils Absolute: 2.3 10*3/uL (ref 1.4–7.0)
Neutrophils: 46 %
Platelets: 134 10*3/uL — ABNORMAL LOW (ref 150–450)
RBC: 4.44 x10E6/uL (ref 4.14–5.80)
RDW: 13.4 % (ref 11.6–15.4)
WBC: 5 10*3/uL (ref 3.4–10.8)

## 2019-02-06 LAB — IRON AND TIBC
Iron Saturation: 28 % (ref 15–55)
Iron: 79 ug/dL (ref 38–169)
Total Iron Binding Capacity: 285 ug/dL (ref 250–450)
UIBC: 206 ug/dL (ref 111–343)

## 2019-02-06 LAB — FERRITIN: Ferritin: 69 ng/mL (ref 30–400)

## 2019-04-05 ENCOUNTER — Other Ambulatory Visit: Payer: Self-pay

## 2019-05-03 ENCOUNTER — Other Ambulatory Visit: Payer: Self-pay

## 2019-05-03 ENCOUNTER — Ambulatory Visit: Payer: Self-pay

## 2019-05-03 NOTE — Progress Notes (Signed)
Labs ordered by Dr. Mariam Dollar at [facility] who follows Seth Long for Hemochromatosis.  Lab results need to be faxed to Dr. Mariam Dollar at [facility].  AMD

## 2019-05-05 LAB — CBC WITH DIFFERENTIAL/PLATELET
Basophils Absolute: 0 10*3/uL (ref 0.0–0.2)
Basos: 1 %
EOS (ABSOLUTE): 0.3 10*3/uL (ref 0.0–0.4)
Eos: 6 %
Hematocrit: 44.1 % (ref 37.5–51.0)
Hemoglobin: 14.6 g/dL (ref 13.0–17.7)
Immature Grans (Abs): 0 10*3/uL (ref 0.0–0.1)
Immature Granulocytes: 0 %
Lymphocytes Absolute: 1.8 10*3/uL (ref 0.7–3.1)
Lymphs: 36 %
MCH: 30.5 pg (ref 26.6–33.0)
MCHC: 33.1 g/dL (ref 31.5–35.7)
MCV: 92 fL (ref 79–97)
Monocytes Absolute: 0.5 10*3/uL (ref 0.1–0.9)
Monocytes: 11 %
Neutrophils Absolute: 2.3 10*3/uL (ref 1.4–7.0)
Neutrophils: 46 %
Platelets: 132 10*3/uL — ABNORMAL LOW (ref 150–450)
RBC: 4.79 x10E6/uL (ref 4.14–5.80)
RDW: 13.1 % (ref 11.6–15.4)
WBC: 4.9 10*3/uL (ref 3.4–10.8)

## 2019-05-05 LAB — FERRITIN: Ferritin: 68 ng/mL (ref 30–400)

## 2019-05-05 LAB — IRON AND TIBC
Iron Saturation: 36 % (ref 15–55)
Iron: 101 ug/dL (ref 38–169)
Total Iron Binding Capacity: 277 ug/dL (ref 250–450)
UIBC: 176 ug/dL (ref 111–343)

## 2019-05-08 DIAGNOSIS — N410 Acute prostatitis: Secondary | ICD-10-CM | POA: Diagnosis not present

## 2019-05-10 ENCOUNTER — Other Ambulatory Visit: Payer: Self-pay

## 2019-05-15 DIAGNOSIS — M7989 Other specified soft tissue disorders: Secondary | ICD-10-CM | POA: Diagnosis not present

## 2019-05-15 DIAGNOSIS — Z23 Encounter for immunization: Secondary | ICD-10-CM | POA: Diagnosis not present

## 2019-05-27 DIAGNOSIS — D696 Thrombocytopenia, unspecified: Secondary | ICD-10-CM | POA: Diagnosis not present

## 2019-06-07 ENCOUNTER — Other Ambulatory Visit: Payer: Self-pay

## 2019-07-03 ENCOUNTER — Other Ambulatory Visit: Payer: Self-pay

## 2019-07-03 ENCOUNTER — Ambulatory Visit: Payer: Self-pay | Admitting: Registered Nurse

## 2019-07-03 ENCOUNTER — Encounter: Payer: Self-pay | Admitting: Registered Nurse

## 2019-07-03 VITALS — BP 111/73 | HR 60 | Temp 97.6°F | Resp 12 | Ht 66.0 in | Wt 179.0 lb

## 2019-07-03 DIAGNOSIS — Z6828 Body mass index (BMI) 28.0-28.9, adult: Secondary | ICD-10-CM

## 2019-07-03 DIAGNOSIS — S00411A Abrasion of right ear, initial encounter: Secondary | ICD-10-CM

## 2019-07-03 DIAGNOSIS — K635 Polyp of colon: Secondary | ICD-10-CM | POA: Insufficient documentation

## 2019-07-03 DIAGNOSIS — E663 Overweight: Secondary | ICD-10-CM

## 2019-07-03 DIAGNOSIS — H9313 Tinnitus, bilateral: Secondary | ICD-10-CM

## 2019-07-03 NOTE — Progress Notes (Signed)
Subjective:    Patient ID: Seth Long, male    DOB: 1961/05/20, 58 y.o.   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  Woke up with blood on his pillow two days ago.  Would like ear checked for infection or blood remaining in his right ear as used qtips to clean blood out of ear.  Patient typically clears ear with qtips every day.  Patient would also like to know if accupuncture could help his tinnitus chronic.  Worst when he is waiting to fall asleep in bed at night.  Has an app that helps but sometimes it shuts off too early and he has a hard time getting back to sleep.  Patient performs lawn chores at home.  Wife made him get rid of backpack blower and now using hand held one with battery quieter than the old one.  Patient last saw [facility] otolaryngology Oct 2019 and was told to follow up in 1 year repeat audiogram.  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     Review of Systems  Constitutional: Negative for activity change, appetite change, chills, diaphoresis, fatigue, fever and unexpected weight change.  HENT: Positive for ear discharge and tinnitus. Negative for congestion, dental problem, drooling, ear pain, facial swelling, trouble swallowing and voice change.   Eyes: Negative for photophobia, pain, discharge, redness, itching and visual disturbance.  Gastrointestinal: Negative for diarrhea, nausea and vomiting.  Endocrine: Negative for cold intolerance and heat intolerance.  Genitourinary: Negative for difficulty urinating.  Musculoskeletal: Negative for gait problem, myalgias, neck pain and neck stiffness.  Skin: Negative for color change, pallor and rash.  Allergic/Immunologic: Positive for environmental allergies. Negative for food allergies.  Neurological: Negative for dizziness, tremors, seizures, syncope, facial asymmetry, speech difficulty, weakness,  light-headedness, numbness and headaches.  Hematological: Negative for adenopathy. Bruises/bleeds easily.  Psychiatric/Behavioral: Positive for sleep disturbance. Negative for agitation and confusion.       Objective:   Physical Exam Vitals signs and nursing note reviewed.  Constitutional:      General: He is awake. He is not in acute distress.    Appearance: Normal appearance. He is well-developed, well-groomed and overweight. He is not ill-appearing, toxic-appearing or diaphoretic.  HENT:     Head: Normocephalic and atraumatic.     Jaw: There is normal jaw occlusion.     Salivary Glands: Right salivary gland is not diffusely enlarged or tender. Left salivary gland is not diffusely enlarged or tender.     Right Ear: Hearing, tympanic membrane and external ear normal. No decreased hearing noted. There is no impacted cerumen.     Left Ear: Hearing, tympanic membrane, ear canal and external ear normal. No decreased hearing noted. There is no impacted cerumen.     Ears:     Comments: Right auditory can with abrasion proximal to TM at 10-11 oclock; no debris or cerumen noted in canal    Nose: Nose normal.     Right Sinus: No maxillary sinus tenderness or frontal sinus tenderness.     Left Sinus: No maxillary sinus tenderness or frontal sinus tenderness.     Mouth/Throat:     Lips: Pink. No lesions.     Mouth: Mucous membranes are moist.     Dentition: No gingival swelling or gum lesions.     Tongue: No lesions. Tongue does not deviate from midline.     Palate: No  mass and lesions.     Pharynx: Uvula midline. Pharyngeal swelling and posterior oropharyngeal erythema present.     Tonsils: No tonsillar exudate.     Comments: Cobblestoning posterior pharynx; bilateral allergic shiners Eyes:     General: Lids are normal. Vision grossly intact. Gaze aligned appropriately. Allergic shiner present. No visual field deficit or scleral icterus.    Extraocular Movements: Extraocular movements  intact.     Conjunctiva/sclera: Conjunctivae normal.     Pupils: Pupils are equal, round, and reactive to light.  Neck:     Musculoskeletal: Normal range of motion and neck supple. Normal range of motion. No edema, erythema, neck rigidity, crepitus, injury, pain with movement or torticollis.     Thyroid: No thyromegaly.     Vascular: No carotid bruit.     Trachea: Trachea normal.  Cardiovascular:     Rate and Rhythm: Normal rate and regular rhythm.     Pulses: Normal pulses.          Radial pulses are 2+ on the right side and 2+ on the left side.     Heart sounds: Normal heart sounds.  Pulmonary:     Effort: Pulmonary effort is normal. No respiratory distress.     Breath sounds: Normal breath sounds and air entry. No stridor, decreased air movement or transmitted upper airway sounds. No decreased breath sounds, wheezing, rhonchi or rales.     Comments: Wearing cloth mask due to covid 19 pandemic; no cough observed in exam room; spoke full sentences without difficulty Abdominal:     General: Abdomen is flat.  Musculoskeletal: Normal range of motion.        General: No swelling, tenderness, deformity or signs of injury.     Right shoulder: Normal.     Left shoulder: Normal.     Right elbow: Normal.    Left elbow: Normal.     Right hip: Normal.     Left hip: Normal.     Right knee: Normal.     Left knee: Normal.     Cervical back: Normal.     Thoracic back: Normal.     Lumbar back: Normal.     Right hand: Normal.     Left hand: Normal.     Right lower leg: No edema.     Left lower leg: No edema.  Lymphadenopathy:     Head:     Right side of head: No submental, submandibular, tonsillar, preauricular, posterior auricular or occipital adenopathy.     Left side of head: No submental, submandibular, tonsillar, preauricular, posterior auricular or occipital adenopathy.     Cervical: No cervical adenopathy.     Right cervical: No superficial, deep or posterior cervical adenopathy.     Left cervical: No superficial, deep or posterior cervical adenopathy.  Skin:    General: Skin is warm and dry.     Capillary Refill: Capillary refill takes less than 2 seconds.     Coloration: Skin is not ashen, cyanotic, jaundiced, mottled, pale or sallow.     Findings: Abrasion present. No abscess, acne, bruising, burn, ecchymosis, erythema, laceration, lesion, petechiae or rash.     Nails: There is no clubbing.   Neurological:     General: No focal deficit present.     Mental Status: He is alert and oriented to person, place, and time. Mental status is at baseline.     GCS: GCS eye subscore is 4. GCS verbal subscore is 5. GCS motor subscore is 6.  Cranial Nerves: Cranial nerves are intact. No cranial nerve deficit, dysarthria or facial asymmetry.     Sensory: Sensation is intact. No sensory deficit.     Motor: Motor function is intact. No weakness, tremor, atrophy, abnormal muscle tone or seizure activity.     Coordination: Coordination is intact. Coordination normal.     Gait: Gait is intact. Gait normal.     Comments: Gait sure and steady in hallway; ; in/out of chair and on/off exam table without difficulty; bilateral hand grasp equal/upper and lower extremity strength equal 5/5  Psychiatric:        Attention and Perception: Attention and perception normal.        Mood and Affect: Mood and affect normal.        Speech: Speech normal.        Behavior: Behavior normal. Behavior is cooperative.        Thought Content: Thought content normal.        Cognition and Memory: Cognition and memory normal.        Judgment: Judgment normal.           Assessment & Plan:  A-abrasion right auditory canal initial encounter, tinnitus bilateral, overweight BMI 28  P-Avoid q-tip/cotton applicator use in ear.  Discussed with patient abrasions frequently seen in clinic and sometimes TMs punctured.  If ear itchy 5-10 gtts mineral oil in ear canal.  If water stuck in canal instill rubbing  alcohol and it will float water from near TM to opening so it can evaporate or be wiped up with cloth.  To clean ears use little finger and washcloth only.  If cloudy ear discharge/swelling/worsening pain/fever schedule re-evaluation of ear.  Patient verbalized understanding information/instructions, agreed with plan of care and had no further questions at this time.  Schedule follow up with [facility].  Consider accupuncture.  Discussed listening to podcast or continue using tinnitus app when going to bed to help distract him so he can fall asleep.  Discussed uncontrolled seasonal allergies can worsen tinnitus/hear heartbeat; use his allergy medications daily as prescribed.  NIH website/studies publishes state benefit can be seen with accupuncture regarding loudness/severity index and quality of life improved after 5-10 sessions.  Discussed with patient to check with his insurance if a covered benefit.  Also ask practitioners if they are certified and if they had any special training for tinnitus treatment when looking for a provider of accupuncture. Discussed providers available in [place] and [facility].  He can also ask his otolaryngologist for recommendation of providers. Discussed exposure to noise greater than 80dB for greater than 10 minutes can also lead to further hearing loss/potentially worsen tinnitus and I recommend when on construction sites, doing yard work or powertools to wear hearing protection.  Patient verbalized understanding information/instructions, agreed with plan of care and had no further questions at this time.  I recommend weight loss BMI 28.

## 2019-07-03 NOTE — Patient Instructions (Addendum)
Abrasion  An abrasion is a cut or a scrape on the outer surface of the skin. An abrasion does not go through all the layers of the skin. It is important to care for your abrasion properly to prevent infection. What are the causes? This condition is caused by falling on or gliding across the ground or another surface. When your skin rubs on something, the outer and inner layers of skin may rub off. What are the signs or symptoms? The main symptom of this condition is a cut or a scrape. The scrape may be bleeding, or it may appear red or pink. If the abrasion was caused by a fall, there may be a bruise under the cut or scrape. How is this diagnosed? An abrasion is diagnosed with a physical exam. How is this treated? Treatment for this condition depends on how large and deep the abrasion is. In most cases:  Your abrasion will be cleaned with water and mild soap. This is done to remove any dirt or debris (such as particles of glass or rock) that may be stuck in the wound.  An antibiotic ointment may be applied to the abrasion to help prevent infection.  A bandage (dressing) may be placed on the abrasion to keep it clean. You may also need a tetanus shot. Follow these instructions at home: Medicines  Take or apply over-the-counter and prescription medicines only as told by your health care provider.  If you were prescribed an antibiotic medicine, apply it as told by your health care provider. Wound care  Clean the wound 2-3 times a day, or as directed by your health care provider. To do this, wash the wound with mild soap and water, rinse off the soap, and pat the wound dry with a clean towel. Do not rub the wound.  Keep the dressing clean and dry as told by your health care provider.  There are many different ways to close and cover a wound. Follow instructions from your health care provider about: ? Caring for your wound. ? Changing and removing your dressing. You may have to change your  dressing one or more times a day, or as directed by your health care provider.  Check your wound every day for signs of infection. Check for: ? Redness, particularly a red streak that spreads out from the wound. ? Swelling or increased pain. ? Warmth. ? Fluid, pus, or a bad smell.  If directed, put ice on the injured area to reduce pain and swelling: ? Put ice in a plastic bag. ? Place a towel between your skin and the bag. ? Leave the ice on for 20 minutes, 2-3 times a day. General instructions  Do not take baths, swim, or use a hot tub until your health care provider says it is okay to do so.  If possible, raise (elevate) the injured area above the level of your heart while you are sitting or lying down. This will reduce pain and swelling.  Keep all follow-up visits as directed by your health care provider. This is important. Contact a health care provider if:  You received a tetanus shot, and you have swelling, severe pain, redness, or bleeding at the injection site.  Your pain is not controlled with medicine.  You have redness, swelling, or more pain at the site of your wound. Get help right away if:  You have a red streak spreading away from your wound.  You have a fever.  You have fluid, blood, or   pus coming from your wound.  You notice a bad smell coming from your wound or your dressing. Summary  An abrasion is a cut or a scrape on the outer surface of the skin. An abrasion does not go through all the layers of the skin.  Care for your abrasion properly to prevent infection.  Clean the wound with mild soap and water 2-3 times a day. Follow instructions from your health care provider about taking medicines and changing your bandage (dressing).  Contact your health care provider if you have redness, swelling or more pain in the wound area.  Get help right away if you have a fever or if you have fluid, blood, pus, a bad smell, or a red streak coming from the wound.  This information is not intended to replace advice given to you by your health care provider. Make sure you discuss any questions you have with your health care provider. Document Released: 06/08/2005 Document Revised: 08/11/2017 Document Reviewed: 04/12/2017 Elsevier Patient Education  2020 [employer] Tinnitus Tinnitus refers to hearing a sound when there is no actual source for that sound. This is often described as ringing in the ears. However, people with this condition may hear a variety of noises, in one ear or in both ears. The sounds of tinnitus can be soft, loud, or somewhere in between. Tinnitus can last for a few seconds or can be constant for days. It may go away without treatment and come back at various times. When tinnitus is constant or happens often, it can lead to other problems, such as trouble sleeping and trouble concentrating. Almost everyone experiences tinnitus at some point. Tinnitus that is long-lasting (chronic) or comes back often (recurs) may require medical attention. What are the causes? The cause of tinnitus is often not known. In some cases, it can result from other problems or conditions, including:  Exposure to loud noises from machinery, music, or other sources.  Hearing loss.  Ear or sinus infections.  Earwax buildup.  An object (foreign body) stuck in the ear.  Taking certain medicines.  Drinking alcohol or caffeine.  High blood pressure.  Heart diseases.  Anemia.  Allergies.  Meniere's disease.  Thyroid problems.  Tumors.  A weak, bulging blood vessel (aneurysm) near the ear.  Depression or other mood disorders. What are the signs or symptoms? The main symptom of tinnitus is hearing a sound when there is no source for that sound. It may sound like:  Buzzing.  Roaring.  Ringing.  Blowing air, like the sound heard when you listen to a seashell.  Hissing.  Whistling.  Sizzling.  Humming.  Running water.  A musical  note.  Tapping. Symptoms may affect only one ear (unilateral) or both ears (bilateral). How is this diagnosed? Tinnitus is diagnosed based on your symptoms, your medical history, and a physical exam. Your health care provider may do a thorough hearing test (audiologic exam) if your tinnitus:  Is unilateral.  Causes hearing difficulties.  Lasts 6 months or longer. You may work with a health care provider who specializes in hearing disorders (audiologist). You may be asked questions about your symptoms and how they affect your daily life. You may have other tests done, such as:  CT scan.  MRI.  An imaging test of how blood flows through your blood vessels (angiogram). How is this treated? Treating an underlying medical condition can sometimes make tinnitus go away. If your tinnitus continues, other treatments may include:  Medicines, such as antidepressants  or sleeping aids.  Sound generators to mask the tinnitus. These include: ? Tabletop sound machines that play relaxing sounds to help you fall asleep. ? Wearable devices that fit in your ear and play sounds or music. ? Acoustic neural stimulation. This involves using headphones to listen to music that contains an auditory signal. Over time, listening to this signal may change some pathways in your brain and make you less sensitive to tinnitus. This treatment is used for very severe cases when no other treatment is working.  Therapy and counseling to help you manage the stress of living with tinnitus.  Using hearing aids or cochlear implants if your tinnitus is related to hearing loss. Hearing aids are worn in the outer ear. Cochlear implants are surgically placed in the inner ear. Follow these instructions at home: Managing symptoms      When possible, avoid being in loud places and being exposed to loud sounds.  Wear hearing protection, such as earplugs, when you are exposed to loud noises.  Use a white noise machine, a  humidifier, or other devices to mask the sound of tinnitus.  Practice techniques for reducing stress, such as meditation, yoga, or deep breathing. Work with your health care provider if you need help with managing stress.  Sleep with your head slightly raised. This may reduce the impact of tinnitus. General instructions  Do not use stimulants, such as nicotine, alcohol, or caffeine. Talk with your health care provider about other stimulants to avoid. Stimulants are substances that can make you feel alert and attentive by increasing certain activities in the body (such as heart rate and blood pressure). These substances may make tinnitus worse.  Take over-the-counter and prescription medicines only as told by your health care provider.  Try to get plenty of sleep each night.  Keep all follow-up visits as told by your health care provider. This is important. Contact a health care provider if:  Your tinnitus continues for 3 weeks or longer without stopping.  Your symptoms get worse or do not get better with home care.  You develop tinnitus after a head injury.  You have tinnitus along with any of the following: ? Dizziness. ? Loss of balance. ? Nausea and vomiting. Summary  Tinnitus refers to hearing a sound when there is no actual source for that sound. This is often described as ringing in the ears.  Symptoms may affect only one ear (unilateral) or both ears (bilateral).  Use a white noise machine, a humidifier, or other devices to mask the sound of tinnitus.  Do not use stimulants, such as nicotine, alcohol, or caffeine. Talk with your health care provider about other stimulants to avoid. These substances may make tinnitus worse. This information is not intended to replace advice given to you by your health care provider. Make sure you discuss any questions you have with your health care provider. Document Released: 08/29/2005 Document Revised: 08/11/2017 Document Reviewed:  06/08/2017 Elsevier Patient Education  2020 [employer]. Allergic Rhinitis, Adult Allergic rhinitis is an allergic reaction that affects the mucous membrane inside the nose. It causes sneezing, a runny or stuffy nose, and the feeling of mucus going down the back of the throat (postnasal drip). Allergic rhinitis can be mild to severe. There are two types of allergic rhinitis:  Seasonal. This type is also called hay fever. It happens only during certain seasons.  Perennial. This type can happen at any time of the year. What are the causes? This condition happens when  the body's defense system (immune system) responds to certain harmless substances called allergens as though they were germs.  Seasonal allergic rhinitis is triggered by pollen, which can come from grasses, trees, and weeds. Perennial allergic rhinitis may be caused by:  House dust mites.  Pet dander.  Mold spores. What are the signs or symptoms? Symptoms of this condition include:  Sneezing.  Runny or stuffy nose (nasal congestion).  Postnasal drip.  Itchy nose.  Tearing of the eyes.  Trouble sleeping.  Daytime sleepiness. How is this diagnosed? This condition may be diagnosed based on:  Your medical history.  A physical exam.  Tests to check for related conditions, such as: ? Asthma. ? Pink eye. ? Ear infection. ? Upper respiratory infection.  Tests to find out which allergens trigger your symptoms. These may include skin or blood tests. How is this treated? There is no cure for this condition, but treatment can help control symptoms. Treatment may include:  Taking medicines that block allergy symptoms, such as antihistamines. Medicine may be given as a shot, nasal spray, or pill.  Avoiding the allergen.  Desensitization. This treatment involves getting ongoing shots until your body becomes less sensitive to the allergen. This treatment may be done if other treatments do not help.  If taking  medicine and avoiding the allergen does not work, new, stronger medicines may be prescribed. Follow these instructions at home:  Find out what you are allergic to. Common allergens include smoke, dust, and pollen.  Avoid the things you are allergic to. These are some things you can do to help avoid allergens: ? Replace carpet with wood, tile, or vinyl flooring. Carpet can trap dander and dust. ? Do not smoke. Do not allow smoking in your home. ? Change your heating and air conditioning filter at least once a month. ? During allergy season:  Keep windows closed as much as possible.  Plan outdoor activities when pollen counts are lowest. This is usually during the evening hours.  When coming indoors, change clothing and shower before sitting on furniture or bedding.  Take over-the-counter and prescription medicines only as told by your health care provider.  Keep all follow-up visits as told by your health care provider. This is important. Contact a health care provider if:  You have a fever.  You develop a persistent cough.  You make whistling sounds when you breathe (you wheeze).  Your symptoms interfere with your normal daily activities. Get help right away if:  You have shortness of breath. Summary  This condition can be managed by taking medicines as directed and avoiding allergens.  Contact your health care provider if you develop a persistent cough or fever.  During allergy season, keep windows closed as much as possible. This information is not intended to replace advice given to you by your health care provider. Make sure you discuss any questions you have with your health care provider. Document Released: 05/24/2001 Document Revised: 08/11/2017 Document Reviewed: 10/06/2016 Elsevier Patient Education  2020 [employer] Inc[employer]  Preventing Health Risks of Being Overweight Maintaining a healthy body weight is an important part of your overall health. Your healthy body  weight depends on your age, gender, and height. Being overweight puts you at risk for many health problems, including:  Heart disease.  Diabetes.  Problems sleeping.  Joint problems. You can make changes to your diet and lifestyle to prevent these risks. Consider working with a health care provider or a dietitian to make these changes. What nutrition  changes can be made?   Eat only as much as your body needs. In most cases, this is about 2,000 calories a day, but the amount varies depending on your height, gender, and activity level. Ask your health care provider how many calories you should have each day. Eating more than your body needs on a regular basis can cause you to become overweight or obese.  Eat slowly, and stop eating when you feel full.  Choose healthy foods, including: ? Fruits and vegetables. ? Lean meats. ? Low-fat dairy products. ? High-fiber foods, such as whole grains and beans. ? Healthy snacks like vegetable sticks, a piece of fruit, or a small amount of yogurt or cheese.  Avoid foods and drinks that are high in sugar, salt (sodium), saturated fat, or trans fat. This includes: ? Many desserts such as candy, cookies, and ice cream. ? Soda. ? Fried foods. ? Processed meats such as hot dogs or lunch meats. ? Prepackaged snack foods. What lifestyle changes can be made?   Exercise for at least 150 minutes a week to prevent weight gain, or as often as recommended by your health care provider. Do moderate-intensity exercise, such as brisk walking. ? Spread it out by exercising for 30 minutes 5 days a week, or in short 10-minute bursts several times a day.  Find other ways to stay active and burn calories, such as yard work or a hobby that involves physical activity.  Get at least 8 hours of sleep each night. When you are well-rested, you are more likely to be active and make healthy choices during the day. To sleep better: ? Try to go to bed and wake up at about  the same time every day. ? Keep your bedroom dark, quiet, and cool. ? Make sure that your bed is comfortable. ? Avoid stimulating activities, such as watching television or exercising, for at least one hour before bedtime. Why are these changes important? Eating healthy and being active helps you lose weight and prevent health problems caused by being overweight. Making these changes can also help you manage stress, feel better mentally, and connect with friends and family. What can happen if changes are not made? Being overweight can affect you for your entire life. You may develop joint or bone problems that make it painful or difficult for you to play sports or do activities you enjoy. Being overweight puts stress on your heart and lungs and can lead to medical problems like diabetes, heart disease, and sleeping problems. Where to find support You can get support for preventing health risks of being overweight from:  Your health care provider or a dietitian. They can provide guidance about healthy eating and healthy lifestyle choices.  Weight loss support groups, online or in-person. Where to find more information  MyPlate: https://ball-collins.biz/ ? This an online tool that provides personalized recommendations about foods to eat each day.  The Centers for Disease Control and Prevention: AffordableScrapbook.gl ? This resource gives tips for managing weight and having an active lifestyle. Summary  To prevent unhealthy weight gain, it is important to maintain a healthy diet high in vegetables and whole grains, exercise regularly, and get at least 8 hours of sleep each night.  Making these changes helps prevent many long-term (chronic) health conditions that can shorten your life, such as diabetes, heart disease, and stroke. This information is not intended to replace advice given to you by your health care provider. Make sure you discuss any questions you have with  your health care  provider. Document Released: 07/26/2017 Document Revised: 09/01/2017 Document Reviewed: 07/26/2017 Elsevier Patient Education  2020 [employer].

## 2019-07-03 NOTE — Progress Notes (Signed)
"  Big drop of red blood" from right ear 2 days ago.  Requesting to have ear checked out because he's on blood thinner.  AMD

## 2019-07-05 ENCOUNTER — Other Ambulatory Visit: Payer: Self-pay

## 2019-08-07 ENCOUNTER — Emergency Department: Payer: No Typology Code available for payment source

## 2019-08-07 ENCOUNTER — Encounter: Payer: Self-pay | Admitting: *Deleted

## 2019-08-07 ENCOUNTER — Other Ambulatory Visit: Payer: Self-pay

## 2019-08-07 ENCOUNTER — Emergency Department
Admission: EM | Admit: 2019-08-07 | Discharge: 2019-08-07 | Disposition: A | Discharge status: Home / Self Care | Payer: No Typology Code available for payment source | Attending: Emergency Medicine | Admitting: Emergency Medicine

## 2019-08-07 DIAGNOSIS — M7918 Myalgia, other site: Secondary | ICD-10-CM | POA: Insufficient documentation

## 2019-08-07 DIAGNOSIS — Z7982 Long term (current) use of aspirin: Secondary | ICD-10-CM | POA: Insufficient documentation

## 2019-08-07 DIAGNOSIS — Z79899 Other long term (current) drug therapy: Secondary | ICD-10-CM | POA: Insufficient documentation

## 2019-08-07 DIAGNOSIS — I2510 Atherosclerotic heart disease of native coronary artery without angina pectoris: Secondary | ICD-10-CM | POA: Insufficient documentation

## 2019-08-07 DIAGNOSIS — I10 Essential (primary) hypertension: Secondary | ICD-10-CM | POA: Diagnosis not present

## 2019-08-07 DIAGNOSIS — Y990 Civilian activity done for income or pay: Secondary | ICD-10-CM | POA: Diagnosis not present

## 2019-08-07 MED ORDER — IBUPROFEN 600 MG PO TABS: 600.0000 mg | ORAL_TABLET | Freq: Three times a day (TID) | ORAL | 0 refills | Status: AC | PRN

## 2019-08-07 MED ORDER — TRAMADOL HCL 50 MG PO TABS: 50.0000 mg | ORAL_TABLET | Freq: Four times a day (QID) | ORAL | 0 refills | Status: AC | PRN

## 2019-08-07 MED ORDER — IBUPROFEN 600 MG PO TABS
600.0000 mg | ORAL_TABLET | Freq: Once | ORAL | Status: AC
  Administered 2019-08-07: 13:00:00 600 mg via ORAL
  Filled 2019-08-07: qty 1

## 2019-08-07 MED ORDER — CYCLOBENZAPRINE HCL 10 MG PO TABS: 10.0000 mg | ORAL_TABLET | Freq: Three times a day (TID) | ORAL | 0 refills | Status: AC | PRN

## 2019-08-07 MED ORDER — CYCLOBENZAPRINE HCL 10 MG PO TABS
10.0000 mg | ORAL_TABLET | Freq: Once | ORAL | Status: AC
  Administered 2019-08-07: 10 mg via ORAL
  Filled 2019-08-07: qty 1

## 2019-08-07 NOTE — ED Notes (Signed)
Signature pad note working; pt verbalizes understanding of DC instructions/ Rx, follow-up appointments.

## 2019-08-07 NOTE — Discharge Instructions (Addendum)
All discharge care instruction take medication as directed. 
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
## 2019-08-07 NOTE — ED Notes (Signed)
Workers comp profile only asks for UDS upon request. No request at this time.
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
## 2019-08-12 ENCOUNTER — Other Ambulatory Visit: Payer: Self-pay

## 2019-09-10 ENCOUNTER — Other Ambulatory Visit: Payer: Self-pay

## 2019-10-04 ENCOUNTER — Other Ambulatory Visit: Payer: Self-pay

## 2019-11-08 ENCOUNTER — Other Ambulatory Visit: Payer: Self-pay

## 2019-11-19 NOTE — Progress Notes (Signed)
Patient comes in today for labs requested by Dr.Granger.

## 2019-11-20 ENCOUNTER — Other Ambulatory Visit: Payer: Self-pay

## 2019-11-20 NOTE — Progress Notes (Signed)
Today's labs are for his Cardiologist.  AMD

## 2019-11-21 LAB — LIPID PANEL
Chol/HDL Ratio: 2.3 ratio (ref 0.0–5.0)
Cholesterol, Total: 117 mg/dL (ref 100–199)
HDL: 50 mg/dL (ref >39)
LDL Chol Calc (NIH): 52 mg/dL (ref 0–99)
Triglycerides: 74 mg/dL (ref 0–149)
VLDL Cholesterol Cal: 15 mg/dL (ref 5–40)

## 2019-11-21 LAB — HGB A1C W/O EAG: Hgb A1c MFr Bld: 5.4 % (ref 4.8–5.6)

## 2019-11-26 ENCOUNTER — Telehealth: Payer: Self-pay

## 2019-11-26 NOTE — Telephone Encounter (Signed)
Lipid Panel & A1c lab results from 11/20/19 faxed [phone]  Dr. Shon Hale at [facility] as requested by patient.  AMD

## 2019-12-04 NOTE — Progress Notes (Signed)
Scheduled to complete physical 12/12/19 with Durward Parcel, PA-C.  AMD

## 2019-12-06 ENCOUNTER — Other Ambulatory Visit: Payer: Self-pay

## 2019-12-06 ENCOUNTER — Ambulatory Visit: Payer: Self-pay

## 2019-12-06 DIAGNOSIS — Z0000 Encounter for general adult medical examination without abnormal findings: Secondary | ICD-10-CM

## 2019-12-06 LAB — POCT URINALYSIS DIPSTICK
Bilirubin, UA: NEGATIVE
Blood, UA: NEGATIVE
Glucose, UA: NEGATIVE
Ketones, UA: NEGATIVE
Leukocytes, UA: NEGATIVE
Nitrite, UA: NEGATIVE
Protein, UA: NEGATIVE
Spec Grav, UA: 1.015
Urobilinogen, UA: 0.2 U/dL
pH, UA: 6

## 2019-12-07 LAB — CMP12+LP+TP+TSH+6AC+PSA+CBC…
ALT: 18 IU/L (ref 0–44)
AST: 19 IU/L (ref 0–40)
Albumin/Globulin Ratio: 1.5 (ref 1.2–2.2)
Albumin: 4.3 g/dL (ref 3.8–4.8)
Alkaline Phosphatase: 43 IU/L (ref 39–117)
BUN/Creatinine Ratio: 18 (ref 10–24)
BUN: 15 mg/dL (ref 8–27)
Basophils Absolute: 0 10*3/uL (ref 0.0–0.2)
Basos: 1 %
Bilirubin Total: 1.2 mg/dL (ref 0.0–1.2)
Calcium: 9 mg/dL (ref 8.6–10.2)
Chloride: 104 mmol/L (ref 96–106)
Chol/HDL Ratio: 2.4 ratio (ref 0.0–5.0)
Cholesterol, Total: 120 mg/dL (ref 100–199)
Creatinine, Ser: 0.83 mg/dL (ref 0.76–1.27)
EOS (ABSOLUTE): 0.4 10*3/uL (ref 0.0–0.4)
Eos: 9 %
Estimated CHD Risk: <0.5 times avg. (ref 0.0–1.0)
Free Thyroxine Index: 1.7 (ref 1.2–4.9)
GFR calc Af Amer: 110 mL/min/{1.73_m2} (ref >59)
GFR calc non Af Amer: 95 mL/min/{1.73_m2} (ref >59)
GGT: 17 IU/L (ref 0–65)
Globulin, Total: 2.8 g/dL (ref 1.5–4.5)
Glucose: 101 mg/dL — ABNORMAL HIGH (ref 65–99)
HDL: 50 mg/dL (ref >39)
Hematocrit: 45.4 % (ref 37.5–51.0)
Hemoglobin: 15.4 g/dL (ref 13.0–17.7)
Immature Grans (Abs): 0 10*3/uL (ref 0.0–0.1)
Immature Granulocytes: 0 %
Iron: 120 ug/dL (ref 38–169)
LDH: 190 IU/L (ref 121–224)
LDL Chol Calc (NIH): 53 mg/dL (ref 0–99)
Lymphocytes Absolute: 1.7 10*3/uL (ref 0.7–3.1)
Lymphs: 35 %
MCH: 31.7 pg (ref 26.6–33.0)
MCHC: 33.9 g/dL (ref 31.5–35.7)
MCV: 93 fL (ref 79–97)
Monocytes Absolute: 0.5 10*3/uL (ref 0.1–0.9)
Monocytes: 11 %
Neutrophils Absolute: 2.1 10*3/uL (ref 1.4–7.0)
Neutrophils: 44 %
Phosphorus: 3.6 mg/dL (ref 2.8–4.1)
Platelets: 130 10*3/uL — ABNORMAL LOW (ref 150–450)
Potassium: 4.5 mmol/L (ref 3.5–5.2)
Prostate Specific Ag, Serum: 0.6 ng/mL (ref 0.0–4.0)
RBC: 4.86 x10E6/uL (ref 4.14–5.80)
RDW: 12.9 % (ref 11.6–15.4)
Sodium: 138 mmol/L (ref 134–144)
T3 Uptake Ratio: 24 % (ref 24–39)
T4, Total: 7.1 ug/dL (ref 4.5–12.0)
TSH: 1.71 u[IU]/mL (ref 0.450–4.500)
Total Protein: 7.1 g/dL (ref 6.0–8.5)
Triglycerides: 88 mg/dL (ref 0–149)
Uric Acid: 4.2 mg/dL (ref 3.8–8.4)
VLDL Cholesterol Cal: 17 mg/dL (ref 5–40)
WBC: 4.7 10*3/uL (ref 3.4–10.8)

## 2019-12-11 NOTE — Progress Notes (Signed)
Labs completed 12/06/2019.  AMD 

## 2019-12-12 ENCOUNTER — Telehealth: Payer: Self-pay

## 2019-12-12 ENCOUNTER — Other Ambulatory Visit: Payer: Self-pay

## 2019-12-12 ENCOUNTER — Encounter: Payer: Self-pay | Admitting: Physician Assistant

## 2019-12-12 ENCOUNTER — Ambulatory Visit: Payer: Self-pay | Admitting: Physician Assistant

## 2019-12-12 VITALS — BP 132/84 | HR 56 | Temp 97.3°F | Resp 12 | Ht 66.0 in | Wt 178.0 lb

## 2019-12-12 DIAGNOSIS — Z0000 Encounter for general adult medical examination without abnormal findings: Secondary | ICD-10-CM

## 2019-12-12 NOTE — Progress Notes (Signed)
   Subjective: Annual exam    Patient ID: Seth Long, male    DOB: September 06, 1961, 59 y.o.   MRN: [id number]  HPI Patient presents for annual exam voices no concerns or complaints.   Review of Systems Depression, GERD, hyperlipidemia, and hypertension.    Objective:   Physical Exam No acute distress.  HEENT is unremarkable.  Neck was supple for adenopathy or bruits.  Lungs clear to auscultation.  Heart is bradycardic at 55 bpm and regular.  Abdomen with negative HSM, normoactive bowel sounds, soft nontender palpation.  No obvious deformity to the upper or lower extremities.  Patient is full neck range of motion upper and lower extremities.  No obvious deformity to the cervical lumbar spine.  Patient full equal range of motion cervical lumbar spine.  Cranial nerves II through XII are grossly intact.       Assessment & Plan: Well exam.  Discussed lab results with patient.  Advised routine follow-up.

## 2019-12-12 NOTE — Telephone Encounter (Signed)
Contact [facility] about Ferritin results.  Customer Service said they missed it because it printed on the back of the requisition.  They still have the blood & she is putting the order in for them to run it.  Should result out tomorrow.  AMD

## 2019-12-13 LAB — FERRITIN: Ferritin: 109 ng/mL (ref 30–400)

## 2019-12-13 LAB — SPECIMEN STATUS REPORT

## 2019-12-18 ENCOUNTER — Telehealth: Payer: Self-pay

## 2019-12-18 NOTE — Telephone Encounter (Signed)
Faxed labs done recently through our office to Demetrus's PCP (Dr. Rolin Barry at [facility]) - Fax:  [phone].  Faxed labs done recently through our office to Raghav's Hematologist  (Dr. Dirk Dress at [facility]) - Fax: [phone].  AMD

## 2020-01-03 ENCOUNTER — Other Ambulatory Visit: Payer: Self-pay

## 2020-03-04 ENCOUNTER — Other Ambulatory Visit: Payer: Self-pay

## 2020-03-04 DIAGNOSIS — R7309 Other abnormal glucose: Secondary | ICD-10-CM

## 2020-03-04 DIAGNOSIS — E785 Hyperlipidemia, unspecified: Secondary | ICD-10-CM

## 2020-03-04 NOTE — Progress Notes (Signed)
Presents for 3 month Follow-up labs for his [facility]:  Dr. Zada Finders ([facility] [place]) P:  [phone] F:  [phone]  Dr. Dirk Dress (Hematologist) P:  [phone] F: [phone]  Dr. Shon Hale (Cardiovascular) P: [phone] F: [phone]  Labs need to faxed to his physicians.  AMD

## 2020-03-05 LAB — IRON AND TIBC
Iron Saturation: 32 % (ref 15–55)
Iron: 86 ug/dL (ref 38–169)
Total Iron Binding Capacity: 271 ug/dL (ref 250–450)
UIBC: 185 ug/dL (ref 111–343)

## 2020-03-05 LAB — LIPID PANEL
Chol/HDL Ratio: 2.3 ratio (ref 0.0–5.0)
Cholesterol, Total: 115 mg/dL (ref 100–199)
HDL: 49 mg/dL (ref >39)
LDL Chol Calc (NIH): 47 mg/dL (ref 0–99)
Triglycerides: 103 mg/dL (ref 0–149)
VLDL Cholesterol Cal: 19 mg/dL (ref 5–40)

## 2020-03-05 LAB — HGB A1C W/O EAG: Hgb A1c MFr Bld: 5.3 % (ref 4.8–5.6)

## 2020-03-05 LAB — FERRITIN: Ferritin: 94 ng/mL (ref 30–400)
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
## 2020-07-23 ENCOUNTER — Other Ambulatory Visit: Payer: Self-pay

## 2020-07-23 DIAGNOSIS — Z1152 Encounter for screening for COVID-19: Secondary | ICD-10-CM

## 2020-07-23 NOTE — Progress Notes (Signed)
Covid testing symptomatic 2 days vax'd

## 2020-07-25 LAB — NOVEL CORONAVIRUS, NAA: SARS-CoV-2, NAA: NOT DETECTED

## 2020-07-25 LAB — SARS-COV-2, NAA 2 DAY TAT

## 2022-01-10 ENCOUNTER — Ambulatory Visit: Payer: 59 | Admitting: Physician Assistant
